# Patient Record
Sex: Female | Born: 1960 | Race: White | Hispanic: No | Marital: Married | Smoking: Never smoker
Health system: Southern US, Community
[De-identification: ages and names within clinical notes are randomized; demographics above are authoritative.]

## PROBLEM LIST (undated history)

## (undated) DIAGNOSIS — E785 Hyperlipidemia, unspecified: Secondary | ICD-10-CM

## (undated) DIAGNOSIS — M1990 Unspecified osteoarthritis, unspecified site: Secondary | ICD-10-CM

## (undated) DIAGNOSIS — G4700 Insomnia, unspecified: Secondary | ICD-10-CM

## (undated) DIAGNOSIS — E079 Disorder of thyroid, unspecified: Secondary | ICD-10-CM

## (undated) DIAGNOSIS — G709 Myoneural disorder, unspecified: Secondary | ICD-10-CM

## (undated) HISTORY — DX: Hyperlipidemia, unspecified: E78.5

## (undated) HISTORY — DX: Myoneural disorder, unspecified: G70.9

## (undated) HISTORY — DX: Disorder of thyroid, unspecified: E07.9

## (undated) HISTORY — DX: Insomnia, unspecified: G47.00

## (undated) HISTORY — DX: Unspecified osteoarthritis, unspecified site: M19.90

---

## 2011-12-25 DIAGNOSIS — K589 Irritable bowel syndrome without diarrhea: Secondary | ICD-10-CM | POA: Insufficient documentation

## 2011-12-25 DIAGNOSIS — F325 Major depressive disorder, single episode, in full remission: Secondary | ICD-10-CM | POA: Insufficient documentation

## 2012-05-13 DIAGNOSIS — R947 Abnormal results of other endocrine function studies: Secondary | ICD-10-CM | POA: Insufficient documentation

## 2013-06-17 DIAGNOSIS — E038 Other specified hypothyroidism: Secondary | ICD-10-CM | POA: Insufficient documentation

## 2013-06-17 DIAGNOSIS — E039 Hypothyroidism, unspecified: Secondary | ICD-10-CM | POA: Insufficient documentation

## 2013-06-17 DIAGNOSIS — F5101 Primary insomnia: Secondary | ICD-10-CM | POA: Insufficient documentation

## 2013-07-07 DIAGNOSIS — M797 Fibromyalgia: Secondary | ICD-10-CM | POA: Insufficient documentation

## 2013-10-04 DIAGNOSIS — M19049 Primary osteoarthritis, unspecified hand: Secondary | ICD-10-CM | POA: Insufficient documentation

## 2014-04-06 LAB — VITAMIN D 25 HYDROXY (VIT D DEFICIENCY, FRACTURES): VIT D 25 HYDROXY: 34.9

## 2014-04-06 LAB — LYME DISEASE AB, QUANT, IGM

## 2014-04-06 LAB — BASIC METABOLIC PANEL
BUN: 15 mg/dL (ref 4–21)
Creatinine: 0.7 mg/dL (ref <=1.1)
GLUCOSE: 124 mg/dL
POTASSIUM: 4.1 mmol/L (ref 3.4–5.3)
Sodium: 142 mmol/L (ref 137–147)

## 2014-04-06 LAB — CBC AND DIFFERENTIAL
HCT: 39 % (ref 36–46)
Hemoglobin: 13.2 g/dL (ref 12.0–16.0)
NEUTROS ABS: 3 /uL
PLATELETS: 297 10*3/uL (ref 150–399)
WBC: 5 10^3/mL

## 2014-04-06 LAB — TSH: TSH: 0.31 u[IU]/mL — AB (ref <=5.90)

## 2014-04-06 LAB — ROCKY MTN SPOTTED FVR AB, IGM-BLOOD
RMSF IGM: 0.29
RMSF IgG: NEGATIVE

## 2014-04-06 LAB — HEPATIC FUNCTION PANEL
ALT: 14 U/L (ref 7–35)
AST: 15 U/L (ref 13–35)
Alkaline Phosphatase: 90 U/L (ref 25–125)
BILIRUBIN, TOTAL: 0.3 mg/dL

## 2014-04-06 LAB — LYME IGG/IGM AB

## 2014-04-06 LAB — CALCIUM: Calcium: 9.8 mg/dL

## 2014-10-28 DIAGNOSIS — M8949 Other hypertrophic osteoarthropathy, multiple sites: Secondary | ICD-10-CM | POA: Insufficient documentation

## 2014-10-28 DIAGNOSIS — M159 Polyosteoarthritis, unspecified: Secondary | ICD-10-CM | POA: Insufficient documentation

## 2014-10-28 DIAGNOSIS — M150 Primary generalized (osteo)arthritis: Principal | ICD-10-CM

## 2014-11-15 DIAGNOSIS — N951 Menopausal and female climacteric states: Secondary | ICD-10-CM | POA: Insufficient documentation

## 2014-12-29 ENCOUNTER — Ambulatory Visit: Payer: Self-pay | Admitting: Internal Medicine

## 2016-05-08 ENCOUNTER — Encounter: Payer: Self-pay | Admitting: Family Medicine

## 2016-05-08 ENCOUNTER — Ambulatory Visit (INDEPENDENT_AMBULATORY_CARE_PROVIDER_SITE_OTHER): Payer: PRIVATE HEALTH INSURANCE | Admitting: Family Medicine

## 2016-05-08 VITALS — BP 110/74 | HR 73 | Ht 63.75 in | Wt 130.6 lb

## 2016-05-08 DIAGNOSIS — M150 Primary generalized (osteo)arthritis: Secondary | ICD-10-CM | POA: Diagnosis not present

## 2016-05-08 DIAGNOSIS — E038 Other specified hypothyroidism: Secondary | ICD-10-CM | POA: Diagnosis not present

## 2016-05-08 DIAGNOSIS — M8949 Other hypertrophic osteoarthropathy, multiple sites: Secondary | ICD-10-CM

## 2016-05-08 DIAGNOSIS — F325 Major depressive disorder, single episode, in full remission: Secondary | ICD-10-CM | POA: Diagnosis not present

## 2016-05-08 DIAGNOSIS — W57XXXA Bitten or stung by nonvenomous insect and other nonvenomous arthropods, initial encounter: Secondary | ICD-10-CM

## 2016-05-08 DIAGNOSIS — M797 Fibromyalgia: Secondary | ICD-10-CM | POA: Diagnosis not present

## 2016-05-08 DIAGNOSIS — M159 Polyosteoarthritis, unspecified: Secondary | ICD-10-CM

## 2016-05-08 DIAGNOSIS — K760 Fatty (change of) liver, not elsewhere classified: Secondary | ICD-10-CM | POA: Insufficient documentation

## 2016-05-08 DIAGNOSIS — F5101 Primary insomnia: Secondary | ICD-10-CM

## 2016-05-08 DIAGNOSIS — E039 Hypothyroidism, unspecified: Secondary | ICD-10-CM

## 2016-05-08 NOTE — Patient Instructions (Signed)
Try to exercise 30 minutes or more 5 days per week of moderate intensity exercise.     Osteoarthritis Osteoarthritis is a disease that causes soreness and inflammation of a joint. It occurs when the cartilage at the affected joint wears down. Cartilage acts as a cushion, covering the ends of bones where they meet to form a joint. Osteoarthritis is the most common form of arthritis. It often occurs in older people. The joints affected most often by this condition include those in the:  Ends of the fingers.  Thumbs.  Neck.  Lower back.  Knees.  Hips. CAUSES  Over time, the cartilage that covers the ends of bones begins to wear away. This causes bone to rub on bone, producing pain and stiffness in the affected joints.  RISK FACTORS Certain factors can increase your chances of having osteoarthritis, including:  Older age.  Excessive body weight.  Overuse of joints.  Previous joint injury. SIGNS AND SYMPTOMS   Pain, swelling, and stiffness in the joint.  Over time, the joint may lose its normal shape.  Small deposits of bone (osteophytes) may grow on the edges of the joint.  Bits of bone or cartilage can break off and float inside the joint space. This may cause more pain and damage. DIAGNOSIS  Your health care provider will do a physical exam and ask about your symptoms. Various tests may be ordered, such as:  X-rays of the affected joint.  Blood tests to rule out other types of arthritis. Additional tests may be used to diagnose your condition. TREATMENT  Goals of treatment are to control pain and improve joint function. Treatment plans may include:  A prescribed exercise program that allows for rest and joint relief.  A weight control plan.  Pain relief techniques, such as:  Properly applied heat and cold.  Electric pulses delivered to nerve endings under the skin (transcutaneous electrical nerve stimulation [TENS]).  Massage.  Certain nutritional  supplements.  Medicines to control pain, such as:  Acetaminophen.  Nonsteroidal anti-inflammatory drugs (NSAIDs), such as naproxen.  Narcotic or central-acting agents, such as tramadol.  Corticosteroids. These can be given orally or as an injection.  Surgery to reposition the bones and relieve pain (osteotomy) or to remove loose pieces of bone and cartilage. Joint replacement may be needed in advanced states of osteoarthritis. HOME CARE INSTRUCTIONS   Take medicines only as directed by your health care provider.  Maintain a healthy weight. Follow your health care provider's instructions for weight control. This may include dietary instructions.  Exercise as directed. Your health care provider can recommend specific types of exercise. These may include:  Strengthening exercises. These are done to strengthen the muscles that support joints affected by arthritis. They can be performed with weights or with exercise bands to add resistance.  Aerobic activities. These are exercises, such as brisk walking or low-impact aerobics, that get your heart pumping.  Range-of-motion activities. These keep your joints limber.  Balance and agility exercises. These help you maintain daily living skills.  Rest your affected joints as directed by your health care provider.  Keep all follow-up visits as directed by your health care provider. SEEK MEDICAL CARE IF:   Your skin turns red.  You develop a rash in addition to your joint pain.  You have worsening joint pain.  You have a fever along with joint or muscle aches. SEEK IMMEDIATE MEDICAL CARE IF:  You have a significant loss of weight or appetite.  You have night sweats.  FOR MORE INFORMATION   National Institute of Arthritis and Musculoskeletal and Skin Diseases: www.niams.http://www.myers.net/nih.gov  [employer] Aging: https://walker.com/www.nia.nih.gov  American College of Rheumatology: www.rheumatology.org   This information is not intended to replace  advice given to you by your health care provider. Make sure you discuss any questions you have with your health care provider.   Document Released: 09/09/2005 Document Revised: 09/30/2014 Document Reviewed: 05/17/2013 Elsevier Interactive Patient Education [employer]2016 Elsevier Inc.

## 2016-05-08 NOTE — Assessment & Plan Note (Addendum)
Lexapro for FM not for mood per pt.     Mood stable- no anxiety/ depressive sx currently.

## 2016-05-08 NOTE — Assessment & Plan Note (Signed)
Strong Fam Hx of hypothyr.   10 yrs ago- had bad sx of hypothyroidism.  Took meds 8 yrs--> but needed to go off due to tsh got too low- 1.5 yrs ago.

## 2016-05-08 NOTE — Progress Notes (Signed)
New patient office visit note:  Impression and Recommendations:    1. Primary osteoarthritis involving multiple joints   2. Fibromyalgia   3. Major depression, single episode, in complete remission (HCC)   4. Subclinical hypothyroidism   5. h/o Tick bites- never tested positive   6. Fatty liver disease, nonalcoholic   7. Primary insomnia     Fibromyalgia Been seeing  Dr Mardelle MatteAndy- PCP.    Tried sevella, lyrica, cymbalta- really never worked.  Rheum Doc- seen couple yrs ago.  [facility] in [place].  She confirmded OA, not RA.  Seeing PT- twice wkly 3 wks. [facility].   Major depression, single episode, in complete remission (HCC) Lexapro for FM not for mood per pt.     Mood stable- no anxiety/ depressive sx currently.   h/o Subclinical hypothyroidism- now N Strong Fam Hx of hypothyr.   10 yrs ago- had bad sx of hypothyroidism.  Took meds 8 yrs--> but needed to go off due to tsh got too low- 1.5 yrs ago.    h/o Fatty liver disease, nonalcoholic This was found incidentally by her gastroenterology team, [facility], in the remote past when they were doing imaging on CT scan and workup for chronic abdominal pain.  Primary insomnia Patient manage with Ambien for this condition. Understands risks associated with chronic use. She has failed trazodone for this condition.  Sleep hygiene and sleep meditation discussed. increase activity level during the day- will help you sleep  Primary osteoarthritis involving multiple joints Explained to patient that first line treatment options for primary osteoarthritis include dietary and lifestyle modifications to include at least 30 minutes of walking 5 or more days per week.   h/o Tick bites- never tested positive - >  Explained to patient that I'm not aware of the latest treatment guidelines and workup of chronic Lyme's condition. Explained it is highly unlikely any of her chronic arthralgias and myalgias are related to Lyme's since she  tested negative after her repeated bites in the past when she got lab work repeatedly at her PCPs office for this.   I have reviewed Dr. Mardelle MatteAndy and her colleagues notes on this.  Explained to patient I'm happy to refer her to another rheumatologist if she feels her condition warrants this.  Pt was in the office today for 40+ minutes, with over 50% time spent in face to face counseling of various medical concerns and in coordination of care  No orders of the defined types were placed in this encounter.   Patient's Medications  New Prescriptions   No medications on file  Previous Medications   CAYENNE 450 MG CAPS    Take 1 capsule by mouth daily.   CHOLECALCIFEROL (VITAMIN D3) 1000 UNITS CAPS    Take 1 tablet by mouth daily.   ESCITALOPRAM (LEXAPRO) 20 MG TABLET    Take 1 tablet by mouth daily.   HYDROCODONE-ACETAMINOPHEN (NORCO/VICODIN) 5-325 MG TABLET    Take 1 tablet by mouth every 6 (six) hours as needed.   MAGNESIUM PO    Take 1 capsule by mouth daily.   MULTIPLE VITAMIN (MULTIVITAMIN) TABLET    Take 1 tablet by mouth daily.   OMEGA-3 FATTY ACIDS (FISH OIL) 1000 MG CAPS    Take 1 capsule by mouth daily.   TURMERIC 450 MG CAPS    Take 1 tablet by mouth daily.   ZOLPIDEM (AMBIEN) 10 MG TABLET    Take 1 tablet by mouth at bedtime.  Modified Medications   No medications on file  Discontinued Medications   No medications on file    Return in about 5 weeks (around 06/12/2016) for Fasting blood work, then f/up OV with me.  The patient was counseled, risk factors were discussed, anticipatory guidance given.  Gross side effects, risk and benefits, and alternatives of medications discussed with patient.  Patient is aware that all medications have potential side effects and we are unable to predict every side effect or drug-drug interaction that may occur.  Expresses verbal understanding and consents to current therapy plan and treatment regimen.  Please see AVS handed out to patient at the end  of our visit for further patient instructions/ counseling done pertaining to today's office visit.    Note: This document was prepared using Dragon voice recognition software and may include unintentional dictation errors.  ----------------------------------------------------------------------------------------------------------------------    Subjective:    Chief Complaint  Patient presents with  . Establish Care    HPI: Alicia Harper is a pleasant 55 y.o. female who [facility] to review their medical history with me and establish care.   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     She has her masters degree.   She is married to Alicia Harper, whom she lives with as well as one of her daughters Alicia Harper age 413.  They have a total of 5 children together 27- 13yo.      Walks-  [id number] most days ; stretches some but no other exercise other than that   Fibromyalgia:    Been seeing  Dr Mardelle MatteAndy- PCP- last seen in 4/17.    Apparently she is on Lexapro for this condition.  Tried sevella, lyrica, cymbalta- really never worked.  So she sent patient to a Rheum Doc- seen couple yrs ago.  [facility] in [place].  She confirmded OA, not RA.   Pt has been doing PT- twice wkly 3 wks at [facility].   Depression:     Denies that she has a problem with anxiety depression.  Taking Lexapro for fibromyalgia not for mood per patient.  Insomnia:     Takes Ambien, failed trazodone.  She understands risks of long-term chronic use   Primary OA:   Affecting mostly her hands and neck at current. Confirmed via  x-rays.  Patient recently started [place] and tumor risk for this condition. Believes in more natural healing.    History of subclinical hypothyroidism:   Patient has a strong family history of hypothyroidism and approximate 10 years ago she states she had bad symptoms of the disease including fatigue, hair loss, feeling cold etc. Apparently she took meds for 8 years for this condition but needed to go off of them due to her TSH getting too low. She has not been on any medicines for at least 1.5 -2 years    History of multiple tick bites:      Patient seen by multiple doctors in the past for this. Apparently she was bitten by many ticks at once and was treated with doxycycline for what she claims was 30 days. Apparently all the tests came back negative and she was  followed up on a regular basis. Patient is convinced that she has chronic Lyme's myalgias and arthralgias and is interested in further testing for this.          Wt Readings from Last 3 Encounters:  05/08/16 130 lb 9.6 oz (59.2 kg)   BP Readings from Last 3 Encounters:  05/08/16 110/74   Pulse Readings from Last 3 Encounters:  05/08/16 73     Patient Active Problem List   Diagnosis Date Noted  . h/o Tick bites- never tested positive 05/08/2016  . h/o Fatty liver disease, nonalcoholic 05/08/2016  . Perimenopausal 11/15/2014  . Primary osteoarthritis involving multiple joints 10/28/2014  . Arthritis of carpometacarpal joint 10/04/2013  . Fibromyalgia 07/07/2013  . Primary insomnia 06/17/2013  . h/o Subclinical hypothyroidism- now N 06/17/2013  . Nonspecific abnormal results of other endocrine function study 05/13/2012  . IBS (irritable bowel syndrome) 12/25/2011  . Major depression, single episode, in complete remission (HCC) 12/25/2011     Past Medical History:  Diagnosis Date  . Arthritis   . Hyperlipidemia   . Insomnia   . Neuromuscular disorder (HCC)    fibromyalgia  . Thyroid disease      History  reviewed. No pertinent surgical history.   Family History  Problem Relation Age of Onset  . Cancer Father     kidney  . Hypertension Father   . Hypertension Maternal Grandmother   . Stroke Maternal Grandmother   . Stroke Maternal Grandfather   . Stroke Paternal Grandmother   . Heart attack Paternal Grandfather      History  Drug Use No    History  Alcohol Use No    History  Smoking Status  . Never Smoker  Smokeless Tobacco  . Never Used    Patient's Medications  New Prescriptions   No medications on file  Previous Medications   CAYENNE 450 MG CAPS    Take 1 capsule by mouth daily.   CHOLECALCIFEROL (VITAMIN D3) 1000 UNITS CAPS    Take 1 tablet by mouth daily.   ESCITALOPRAM (LEXAPRO) 20 MG TABLET    Take 1 tablet by mouth daily.   HYDROCODONE-ACETAMINOPHEN (NORCO/VICODIN) 5-325 MG TABLET    Take 1 tablet by mouth every 6 (six) hours as needed.   MAGNESIUM PO    Take 1 capsule by mouth daily.   MULTIPLE VITAMIN (MULTIVITAMIN) TABLET    Take 1 tablet by mouth daily.   OMEGA-3 FATTY ACIDS (FISH OIL) 1000 MG CAPS    Take 1 capsule by mouth daily.   TURMERIC 450 MG CAPS    Take 1 tablet by mouth daily.   ZOLPIDEM (AMBIEN) 10 MG TABLET    Take 1 tablet by mouth at bedtime.  Modified Medications   No medications on file  Discontinued Medications   No medications on file    Allergies: Tramadol and Savella [milnacipran]  Review of Systems:   ( Completed via Adult Medical History Intake form today) General:  Denies fever, chills, appetite changes, unexplained weight loss.  Optho/Auditory:   Denies visual changes, blurred vision/LOV, ringing in ears/ diff hearing Respiratory:   Denies SOB, DOE, cough, wheezing.  Cardiovascular:   Denies chest pain, palpitations, new onset peripheral edema  Gastrointestinal:   Denies nausea, vomiting, diarrhea.  Genitourinary:    Denies dysuria, increased frequency, flank pain.  Endocrine:     Denies hot or cold intolerance, polyuria,  polydipsia. Musculoskeletal:  Denies unexplained myalgias, joint swelling, arthralgias, gait problems.  Skin:  Denies rash,  suspicious lesions or new/ changes in moles Neurological:    Denies dizziness, syncope, unexplained weakness, lightheadedness, numbness  Psychiatric/Behavioral:   Denies mood changes, suicidal or homicidal ideations, hallucinations    Objective:   Blood pressure 110/74, pulse 73, height 5' 3.75" (1.619 m), weight 130 lb 9.6 oz (59.2 kg). Body mass index is 22.59 kg/m.   General: Well Developed, well nourished, and in no acute distress.  Neuro: Alert and oriented x3, extra-ocular muscles intact, sensation grossly intact.  HEENT: Normocephalic, atraumatic, pupils equal round reactive to light, neck supple, no gross masses, no carotid bruits, no JVD apprec Skin: no gross suspicious lesions or rashes  Cardiac: Regular rate and rhythm, no murmurs rubs or gallops.  Respiratory: Essentially clear to auscultation bilaterally. Not using accessory muscles, speaking in full sentences.  Abdominal: Soft, not grossly distended Musculoskeletal: Ambulates w/o diff, FROM * 4 ext.  Vasc: less 2 sec cap RF, warm and pink  Psych:  No HI/SI, judgement and insight good.

## 2016-05-08 NOTE — Assessment & Plan Note (Addendum)
Been seeing  Dr Mardelle MatteAndy- PCP.    Tried sevella, lyrica, cymbalta- really never worked.  Rheum Doc- seen couple yrs ago.  [facility] in [place].  She confirmded OA, not RA.  Seeing PT- twice wkly 3 wks. [facility].

## 2016-05-19 NOTE — Assessment & Plan Note (Addendum)
- >    Explained to patient that I'm not aware of the latest treatment guidelines and workup of chronic Lyme's condition. Explained it is highly unlikely any of her chronic arthralgias and myalgias are related to Lyme's since she tested negative after her repeated bites in the past when she got lab work repeatedly at her PCPs office for this.   I have reviewed Dr. Mardelle MatteAndy and her colleagues notes on this.  Explained to patient I'm happy to refer her to another rheumatologist if she feels her condition warrants this.

## 2016-05-19 NOTE — Assessment & Plan Note (Signed)
This was found incidentally by her gastroenterology team, [facility], in the remote past when they were doing imaging on CT scan and workup for chronic abdominal pain.

## 2016-05-19 NOTE — Assessment & Plan Note (Addendum)
Patient manage with Ambien for this condition. Understands risks associated with chronic use. She has failed trazodone for this condition.  Sleep hygiene and sleep meditation discussed. increase activity level during the day- will help you sleep

## 2016-05-19 NOTE — Assessment & Plan Note (Signed)
Explained to patient that first line treatment options for primary osteoarthritis include dietary and lifestyle modifications to include at least 30 minutes of walking 5 or more days per week.

## 2016-05-20 ENCOUNTER — Telehealth: Payer: Self-pay | Admitting: Family Medicine

## 2016-05-20 ENCOUNTER — Other Ambulatory Visit: Payer: Self-pay

## 2016-05-20 DIAGNOSIS — E039 Hypothyroidism, unspecified: Secondary | ICD-10-CM

## 2016-05-20 DIAGNOSIS — R947 Abnormal results of other endocrine function studies: Secondary | ICD-10-CM

## 2016-05-20 DIAGNOSIS — Z0000 Encounter for general adult medical examination without abnormal findings: Secondary | ICD-10-CM

## 2016-05-20 DIAGNOSIS — E038 Other specified hypothyroidism: Secondary | ICD-10-CM

## 2016-05-20 NOTE — Telephone Encounter (Signed)
Patient is coming in tomorrow for fasting labs (8/29). She had talked about doing a blood panel for tick transmitted diseases with Dr Val Eagle the initial visit and was wondering if that was still possible tomorrow. I didn't see any labs in at all so I told the patient I'd find out and get back with her to let her know today. If you call instead of me she can be reached all day on her mobile and leave a VM if she doesn't pick up.

## 2016-05-20 NOTE — Telephone Encounter (Signed)
Informed pt that per Dr. Synthia Innocentpalski's previous note, she would need to see rheumatologist for Lyme disease evaluation.  Offered pt referral to rheumatology, but pt states that she already has a rheumatologist that she sees.  Alicia Harper. Niklas Chretien, CMA

## 2016-05-21 ENCOUNTER — Other Ambulatory Visit (INDEPENDENT_AMBULATORY_CARE_PROVIDER_SITE_OTHER): Payer: PRIVATE HEALTH INSURANCE

## 2016-05-21 DIAGNOSIS — Z0000 Encounter for general adult medical examination without abnormal findings: Secondary | ICD-10-CM

## 2016-05-21 DIAGNOSIS — E038 Other specified hypothyroidism: Secondary | ICD-10-CM

## 2016-05-21 DIAGNOSIS — E039 Hypothyroidism, unspecified: Secondary | ICD-10-CM

## 2016-05-21 DIAGNOSIS — R947 Abnormal results of other endocrine function studies: Secondary | ICD-10-CM | POA: Diagnosis not present

## 2016-05-22 LAB — COMPREHENSIVE METABOLIC PANEL
ALBUMIN: 4.2 g/dL (ref 3.6–5.1)
ALK PHOS: 82 U/L (ref 33–130)
ALT: 16 U/L (ref 6–29)
AST: 20 U/L (ref 10–35)
BUN: 15 mg/dL (ref 7–25)
CHLORIDE: 101 mmol/L (ref 98–110)
CO2: 31 mmol/L (ref 20–31)
Calcium: 9.7 mg/dL (ref 8.6–10.4)
Creat: 0.81 mg/dL (ref 0.50–1.05)
GLUCOSE: 97 mg/dL (ref 65–99)
Potassium: 4.5 mmol/L (ref 3.5–5.3)
SODIUM: 141 mmol/L (ref 135–146)
TOTAL PROTEIN: 6.5 g/dL (ref 6.1–8.1)
Total Bilirubin: 0.4 mg/dL (ref 0.2–1.2)

## 2016-05-22 LAB — CBC WITH DIFFERENTIAL/PLATELET
BASOS ABS: 0 {cells}/uL (ref 0–200)
Basophils Relative: 0 %
EOS ABS: 86 {cells}/uL (ref 15–500)
Eosinophils Relative: 2 %
HEMATOCRIT: 37.5 % (ref 35.0–45.0)
HEMOGLOBIN: 12.5 g/dL (ref 11.7–15.5)
LYMPHS ABS: 1247 {cells}/uL (ref 850–3900)
Lymphocytes Relative: 29 %
MCH: 28.9 pg (ref 27.0–33.0)
MCHC: 33.3 g/dL (ref 32.0–36.0)
MCV: 86.6 fL (ref 80.0–100.0)
MONO ABS: 387 {cells}/uL (ref 200–950)
MPV: 9.5 fL (ref 7.5–12.5)
Monocytes Relative: 9 %
NEUTROS PCT: 60 %
Neutro Abs: 2580 cells/uL (ref 1500–7800)
Platelets: 287 10*3/uL (ref 140–400)
RBC: 4.33 MIL/uL (ref 3.80–5.10)
RDW: 13.8 % (ref 11.0–15.0)
WBC: 4.3 10*3/uL (ref 3.8–10.8)

## 2016-05-22 LAB — LIPID PANEL
CHOL/HDL RATIO: 3.3 ratio (ref <=5.0)
Cholesterol: 209 mg/dL — ABNORMAL HIGH (ref 125–200)
HDL: 63 mg/dL (ref >=46)
LDL CALC: 119 mg/dL (ref <130)
Triglycerides: 136 mg/dL (ref <150)
VLDL: 27 mg/dL (ref <30)

## 2016-05-22 LAB — VITAMIN D 25 HYDROXY (VIT D DEFICIENCY, FRACTURES): Vit D, 25-Hydroxy: 50 ng/mL (ref 30–100)

## 2016-05-22 LAB — HEMOGLOBIN A1C
HEMOGLOBIN A1C: 5.7 % — AB (ref <5.7)
MEAN PLASMA GLUCOSE: 117 mg/dL

## 2016-05-22 LAB — TSH: TSH: 3.79 mIU/L

## 2016-06-04 ENCOUNTER — Encounter: Payer: Self-pay | Admitting: Family Medicine

## 2016-06-04 ENCOUNTER — Ambulatory Visit (INDEPENDENT_AMBULATORY_CARE_PROVIDER_SITE_OTHER): Payer: PRIVATE HEALTH INSURANCE | Admitting: Family Medicine

## 2016-06-04 VITALS — BP 122/72 | HR 73 | Wt 134.0 lb

## 2016-06-04 DIAGNOSIS — E039 Hypothyroidism, unspecified: Secondary | ICD-10-CM

## 2016-06-04 DIAGNOSIS — L299 Pruritus, unspecified: Secondary | ICD-10-CM

## 2016-06-04 DIAGNOSIS — R7303 Prediabetes: Secondary | ICD-10-CM

## 2016-06-04 DIAGNOSIS — R21 Rash and other nonspecific skin eruption: Secondary | ICD-10-CM

## 2016-06-04 DIAGNOSIS — E038 Other specified hypothyroidism: Secondary | ICD-10-CM

## 2016-06-04 DIAGNOSIS — R6889 Other general symptoms and signs: Secondary | ICD-10-CM | POA: Insufficient documentation

## 2016-06-04 DIAGNOSIS — J3089 Other allergic rhinitis: Secondary | ICD-10-CM | POA: Diagnosis not present

## 2016-06-04 DIAGNOSIS — R5382 Chronic fatigue, unspecified: Secondary | ICD-10-CM

## 2016-06-04 DIAGNOSIS — R0982 Postnasal drip: Secondary | ICD-10-CM | POA: Diagnosis not present

## 2016-06-04 DIAGNOSIS — L659 Nonscarring hair loss, unspecified: Secondary | ICD-10-CM

## 2016-06-04 DIAGNOSIS — E559 Vitamin D deficiency, unspecified: Secondary | ICD-10-CM

## 2016-06-04 MED ORDER — TRIAMCINOLONE ACETONIDE 0.1 % EX OINT: 1.0000 "application " | TOPICAL_OINTMENT | Freq: Three times a day (TID) | CUTANEOUS | 1 refills | Status: DC

## 2016-06-04 NOTE — Patient Instructions (Addendum)
On for URI type symptoms-take Flonase over-the-counter daily. This should be 1 squirt in each nostril after you do your sinus rinses twice daily. Milta Deiters med sinus rinse, AYR or Nettie pot etc)   For the itch, take an H2 blocker- rantidine OTC daily as well as allegra -D- I would cont this thru the allergy season.  Also use the steroid cream I gave you 2-3 times daily just on the "bites".   Shoot for 13mn daily cardio- thru speed walking   Recheck TSH in 3 months, A1c.    Please see this website for more information about fibromyalgia:  https://[email or web address].[email or web address].it What Is Fibromyalgia?  Fibromyalgia is a common disorder that involves widespread pain, tenderness, fatigue, and other symptoms. It's not a form of arthritis, but like arthritis, it can interfere with a person's ability to perform everyday activities. An estimated 5 million American adults have fibromyalgia. Between 80 and 90 percent of people with fibromyalgia are women, but men and children can also have this condition.  More About Fibromyalgia  For more information about fibromyalgia, visit the [facility] Ar[facility] and Musculoskeletal and Skin Diseases Web site.   What's the B[employer]gy Corporationregarding alternative treatments for Fibromyalgia?  What do we know about the effectiveness of complementary health approaches for fibromyalgia?  Although some studies of tai chi, yoga, mindfulness meditation, and biofeedback for fibromyalgia have had promising results, the evidence is too limited to allow definite conclusions to be reached about whether these approaches are helpful.  It's uncertain whether acupuncture is helpful for fibromyalgia pain.  Vitamin D supplements may reduce pain in people with fibromyalgia who are deficient in this vitamin.  Some preliminary research on transcranial magnetic stimulation (TMS) for fibromyalgia symptoms has had promising results.  What do we know about the  safety of complementary health approaches for fibromyalgia?   The mind and body approaches discussed here generally have good safety records. However, some may need to be adapted to make them safe and comfortable for people with fibromyalgia.  Some of the natural products that have been studied for fibromyalgia may have side effects or interact with medications.  Headaches sometimes occur as a side effect of TMS for fibromyalgia. TMS and other procedures involving magnets may not be safe for people who have metal implants or medical devices such as pacemakers in their bodies.   What the SSonomafor Fibromyalgia: Mind and Body Practices:  -Acupuncture -Biofeedback -Guided Imagery -Massage Therapy -Meditative Movement Practices (Tai Chi, QMarcelino Duster and Yoga) -Mindfulness Meditation -Other Mind and Body Practices  Natural Products:  -It has been suggested that deficiencies in vitamin D might worsen fibromyalgia symptoms. In one study of women with fibromyalgia who had low vitamin D levels, 20 weeks of vitamin D supplementation led to a reduction in pain.  -Researchers are investigating whether low magnesium levels contribute to fibromyalgia and if magnesium supplements might help to reduce symptoms.  -Other natural products that have been studied for fibromyalgia include dietary supplements such as soy, S-adenosyl-L-methionine (SAMe), and creatine, and topical products containing capsaicin (the substance that gives chili peppers their heat). --> There's not enough evidence to determine whether these products are helpful.  - And remember, "natural" doesn't necessarily mean "safe." Natural products can have side effects, and some may interact with medications. Even vitamins and minerals can be harmful if taken in excessive amounts.  Make sure he work with your doctor and monitor for adequate levels.   Other Complementary  Approaches to  Explore: -Balneotherapy -Homeopathy -Magnetic Therapies -Reiki   NCCIH-Funded Research  Recent NCCIH-sponsored studies have been investigating various aspects of complementary and integrative interventions for fibromyalgia, including:  -The effectiveness of traditional [place] medicine for treating fibromyalgia -How tai chi compares to aerobic exercise as an adjunctive treatment for fibromyalgia symptoms -Whether brain responses to placebos differ between people with fibromyalgia and healthy people.  More to Consider: Be aware that some complementary health approaches-particularly dietary supplements-may interact with conventional medical treatments. To learn more about using dietary supplements, see the [facility] fact sheet Using Dietary Supplements Wisely and the [facility] interactive module Understanding Drug-Supplement Interactions.  If you're considering a practitioner-provided complementary health approach such as acupuncture, check with your insurer to see if the services will be covered, and ask a trusted source (like your fibromyalgia health care provider or a nearby hospital or medical school) to recommend a practitioner.  Tell all your health care providers about any complementary or integrative health approaches you use. Give them a full picture of what you do to manage your health. This will help ensure coordinated and safe care.   For More Information:  1) [place]: The [place] provides information on [facility] and complementary and integrative health approaches, including publications and searches of [employer] of scientific and medical literature. The Clearinghouse does not provide medical advice, treatment recommendations, or referrals to practitioners. Toll-free in the [place].:   3204949757 TTY (for deaf and hard-of-hearing callers):   [phone] Web site:   VoipGurus.fr E-mail:   [email or web address] .[email or web address].es (link sends e-mail)  2) [place] (NIAMS):  The mission of NIAMS is to support research into the causes, treatment, and prevention of arthritis and musculoskeletal and skin diseases; the training of basic and clinical scientists to carry out this research; and the dissemination of information on research progress in these diseases. Toll-free in the [place].:   1-877-22-NIAMS Web site:   Www.niams.SouthExposed.es  3) PubMed A service of the [employer] of Medicine, PubMed contains publication information and (in most cases) brief summaries of articles from scientific and medical journals. For guidance from [facility] on using PubMed, see How To Find Information About Complementary Health Approaches on PubMed. Web site:   https://www.davis.org/  4) MedlinePlus To provide resources that help answer health questions, MedlinePlus (a service of the [employer] of Medicine) brings together authoritative information from the [employer] as well as other Government agencies and [employer]. Web site:   Www.medlineplus.gov  ----------------------------------------------------------------------------------------------------------------------  Certified Practitioners in Acupuncture and [place] certified   Lenetta Quaker, [place] Gender - Female [address]  [place] [email or web address] [phone] Gender - Female  [place] [phone]   Isabella Stalling [phone] Gender - Female [place] [address]   [place], [place] [phone] Gender - Female [address] [place] [phone] [email or web address]    Scheryl Marten [phone] Gender - Female [place] [phone] [email or web address] [phone] Gender - Female [place] [email or web address] [phone] Gender - Female [place] [address]  [place], [place] [phone] Gender - Female [address] Ignacia Marvel  [place] [address]   Harlene Salts [phone] Gender - Female [place] [address]   [place], [place] Gender - Female [place] [phone]

## 2016-06-04 NOTE — Assessment & Plan Note (Signed)
Reck 75mo

## 2016-06-04 NOTE — Progress Notes (Signed)
Assessment and plan:  1. Itching   2. Rash and nonspecific skin eruption   3. PND (post-nasal drip)   4. Environmental and seasonal allergies   5. Vitamin D insufficiency   6. Prediabetes   7. h/o Subclinical hypothyroidism- now N   8. Chronic fatigue   9. Hair loss   10. Cold intolerance    For the itch, take an H2 blocker- rantidine OTC daily as well as allegra -D- I would cont this thru the allergy season.  Also use the steroid ointment I prescribed you 2-3 times daily just on the red bumps. ( d/c pt ?able flee bites but pt says no way.) Precautions d/c pt anyway.  On for URI / Hay fever type symptoms-take Flonase over-the-counter daily. This should be 1 squirt in each nostril after you do your sinus rinses twice daily. Milta Deiters med sinus rinse, AYR or Nettie pot etc)  I also told patient she can add a Allegra-D or Zyrtec-D etc. to her regimen if she would like.  - Vitamin D supplementation discussed with patient.  - Lab results and with the diagnosis of prediabetes means discussed in detail. All questions answered. Discussed dietary and lifestyle modifications with patient. Recheck 6 months  - We'll refer patient to endocrinology for her marriage of symptoms which have been chronic and ongoing for many years. Thyroid function looks within normal limits on recent labs- patient would like to see the specialist about her sx  Shoot for 46mn daily cardio- thru speed walking; adequate water intake stressed to patient   Recheck TSH in 3 months, A1c.  Pt was in the office today for 40+ minutes, with over 50% time spent in face to face counseling of various medical concerns and in coordination of care   Patient's Medications  New Prescriptions   TRIAMCINOLONE OINTMENT (KENALOG) 0.1 %    Apply 1 application topically 3 (three) times daily. To affected areas  Previous Medications   CHOLECALCIFEROL (VITAMIN D3) 1000 UNITS  CAPS    Take 1 tablet by mouth daily.   ESCITALOPRAM (LEXAPRO) 20 MG TABLET    Take 1 tablet by mouth daily.   FLAXSEED, LINSEED, (FLAX SEEDS PO)    Take by mouth.   HYDROCODONE-ACETAMINOPHEN (NORCO/VICODIN) 5-325 MG TABLET    Take 1 tablet by mouth every 6 (six) hours as needed.   MAGNESIUM PO    Take 1 capsule by mouth daily.   MULTIPLE VITAMIN (MULTIVITAMIN) TABLET    Take 1 tablet by mouth daily.   TURMERIC 450 MG CAPS    Take 1 tablet by mouth daily.   ZOLPIDEM (AMBIEN) 10 MG TABLET    Take 1 tablet by mouth at bedtime.  Modified Medications   No medications on file  Discontinued Medications   CAYENNE 450 MG CAPS    Take 1 capsule by mouth daily.   OMEGA-3 FATTY ACIDS (FISH OIL) 1000 MG CAPS    Take 1 capsule by mouth daily.    Return in about 3 months (around 09/03/2016) for f/up chronic issues.  Anticipatory guidance and routine counseling done re: condition, txmnt options and need for follow up. All questions of patient's were answered.   Gross side effects, risk and benefits, and alternatives of medications discussed with patient.  Patient is aware that all medications have potential side effects and we are unable to predict every sideeffect or drug-drug interaction that may occur.  Expresses verbal understanding and consents to current therapy plan  and treatment regiment.  Please see AVS handed out to patient at the end of our visit for additional patient instructions/ counseling done pertaining to today's office visit.  Note: This document was prepared using Dragon voice recognition software and may include unintentional dictation errors.   ----------------------------------------------------------------------------------------------------------------------  Subjective:   CC: Alicia Harper is a 55 y.o. female who presents to [place] [facility] today to  1) review her recent labs and discuss dietary and lifestyle counseling to help prevent disease.   However, she has several additional concerns   2) Itching all over- generalized for months now- all summer. No rash at all. Just felt itchy on hair, body, face, ears etc.  Than 2 wks ago--> started with red itchy bumps started on neck first then went to legs-> distal on ankles to proximal.  Itch NOT worse at night.  Waves of intensity throughout the day--> sometimes not itch at all and other times very bad.  Tried benadryl spray, campo-phen, HC 1% cream, benadryl.  No one else in the family has rash    3) She also wants to know why she has extreme fatigue, hair loss and cold intolerance for over 2 months.  She is "certain it is b/c of her thyroid."      4) also complains of lymph nodes are swollen, sore throat and pressure in her ears 1 month.    5) also complains of generalized joint and muscle aches as well as a.m. stiffness. She states she gets this way because of her fibromyalgia and generalized osteoarthritis. Pt wants to know why she gets these flairs.    Not exercising at all. Not hitting her goal water intake daily.      She denies feeling depressed, anxious, or sad. She says these are just symptoms of feeling tired and having little energy. She is convinced that she has an abnormality of her thyroid and saw a endocrinologist for her sx in past.   Lost to f/up as they told her she did not need meds any longer   Wt Readings from Last 3 Encounters:  06/04/16 134 lb (60.8 kg)  05/08/16 130 lb 9.6 oz (59.2 kg)   BP Readings from Last 3 Encounters:  06/04/16 122/72  05/08/16 110/74   Pulse Readings from Last 3 Encounters:  06/04/16 73  05/08/16 73      Full medical history updated and reviewed in the office today  Patient Active Problem List   Diagnosis Date Noted  . Itching 06/04/2016  . Rash and nonspecific skin eruption 06/04/2016  . PND (post-nasal drip) 06/04/2016  . Environmental and seasonal allergies 06/04/2016  . Vitamin D insufficiency 06/04/2016  .  Prediabetes 06/04/2016  . Chronic fatigue 06/04/2016  . Hair loss 06/04/2016  . Cold intolerance 06/04/2016  . h/o Tick bites- never tested positive 05/08/2016  . h/o Fatty liver disease, nonalcoholic 83/41/9622  . Perimenopausal 11/15/2014  . Primary osteoarthritis involving multiple joints 10/28/2014  . Arthritis of carpometacarpal joint 10/04/2013  . Fibromyalgia 07/07/2013  . Primary insomnia 06/17/2013  . h/o Subclinical hypothyroidism- now N 06/17/2013  . Nonspecific abnormal results of other endocrine function study 05/13/2012  . IBS (irritable bowel syndrome) 12/25/2011  . Major depression, single episode, in complete remission ([place]) 12/25/2011    Past Medical History:  Diagnosis Date  . Arthritis   . Hyperlipidemia   . Insomnia   . Neuromuscular disorder (HCC)    fibromyalgia  . Thyroid disease  No past surgical history on file.  Social History  Substance Use Topics  . Smoking status: Never Smoker  . Smokeless tobacco: Never Used  . Alcohol use No    family history includes Cancer in her father; Heart attack in her paternal grandfather; Hypertension in her father and maternal grandmother; Stroke in her maternal grandfather, maternal grandmother, and paternal grandmother.   Medications: Current Outpatient Prescriptions  Medication Sig Dispense Refill  . Cholecalciferol (VITAMIN D3) 1000 units CAPS Take 1 tablet by mouth daily.    Marland Kitchen escitalopram (LEXAPRO) 20 MG tablet Take 1 tablet by mouth daily.    . Flaxseed, Linseed, (FLAX SEEDS PO) Take by mouth.    Marland Kitchen HYDROcodone-acetaminophen (NORCO/VICODIN) 5-325 MG tablet Take 1 tablet by mouth every 6 (six) hours as needed.    Marland Kitchen MAGNESIUM PO Take 1 capsule by mouth daily.    . Multiple Vitamin (MULTIVITAMIN) tablet Take 1 tablet by mouth daily.    . Turmeric 450 MG CAPS Take 1 tablet by mouth daily.    Marland Kitchen zolpidem (AMBIEN) 10 MG tablet Take 1 tablet by mouth at bedtime.    . triamcinolone ointment (KENALOG) 0.1 %  Apply 1 application topically 3 (three) times daily. To affected areas 60 g 1   No current facility-administered medications for this visit.     Allergies:  Allergies  Allergen Reactions  . Tramadol Itching  . Savella [Milnacipran] Palpitations    Chest pain     ROS:  Const:    no fevers, chills- no just cold intol Eyes:    See above HPI ENT:  no hearing difficulties, no dysphagia, no dysphonia, no nose bleeds CV:   no chest pain, arrhythmias, no orthopnea, no PND Pulm:   no SOB at rest or exertion, no Wheeze, no DIB, no hemoptysis GI:    no N/V/D/C, no abd pain GU:   no blood in urine or inc freq or urgency Heme/Onc:    no unexplained bleeding, no night sweats, no more fatigue than usual- just constant for months now Neuro:   No dizziness, no LOC, No unexplained weakness or numbness- chronic due to OA and FM Endo:   no unexplained wt loss or gain M-Sk:   No NEW localized myalgias or arthralgias Psych:    No SI/HI, no memory prob or unexplained confusion    Objective:  Blood pressure 122/72, pulse 73, weight 134 lb (60.8 kg), SpO2 97 %. Body mass index is 23.18 kg/m.  Gen:   Well NAD, A and O *3 HEENT:    [place]/AT, EOMI,  MMM, OP- clr Lungs:   Normal work of breathing. CTA B/L, no Wh, rhonchi Heart:   RRR, S1, S2 WNL's, no MRG Abd:   Soft. No gross distention Exts:    warm, pink,  Brisk capillary refill, warm and well perfused.  Psych:    No HI/SI, judgement and insight good, Euthymic mood. Full Affect.   Recent Results (from the past 2160 hour(s))  Comp Met (CMET)     Status: None   Collection Time: 05/21/16  8:40 AM  Result Value Ref Range   Sodium 141 135 - 146 mmol/L   Potassium 4.5 3.5 - 5.3 mmol/L   Chloride 101 98 - 110 mmol/L   CO2 31 20 - 31 mmol/L   Glucose, Bld 97 65 - 99 mg/dL   BUN 15 7 - 25 mg/dL   Creat 0.81 0.50 - 1.05 mg/dL    Comment:   For patients > or =  53 years of age: The upper reference limit for Creatinine is approximately 13% higher for  people identified as African-American.      Total Bilirubin 0.4 0.2 - 1.2 mg/dL   Alkaline Phosphatase 82 33 - 130 U/L   AST 20 10 - 35 U/L   ALT 16 6 - 29 U/L   Total Protein 6.5 6.1 - 8.1 g/dL   Albumin 4.2 3.6 - 5.1 g/dL   Calcium 9.7 8.6 - 10.4 mg/dL  CBC w/Diff     Status: None   Collection Time: 05/21/16  8:40 AM  Result Value Ref Range   WBC 4.3 3.8 - 10.8 K/uL   RBC 4.33 3.80 - 5.10 MIL/uL   Hemoglobin 12.5 11.7 - 15.5 g/dL   HCT 37.5 35.0 - 45.0 %   MCV 86.6 80.0 - 100.0 fL   MCH 28.9 27.0 - 33.0 pg   MCHC 33.3 32.0 - 36.0 g/dL   RDW 13.8 11.0 - 15.0 %   Platelets 287 140 - 400 K/uL   MPV 9.5 7.5 - 12.5 fL   Neutro Abs 2,580 1,500 - 7,800 cells/uL   Lymphs Abs 1,247 850 - 3,900 cells/uL   Monocytes Absolute 387 200 - 950 cells/uL   Eosinophils Absolute 86 15 - 500 cells/uL   Basophils Absolute 0 0 - 200 cells/uL   Neutrophils Relative % 60 %   Lymphocytes Relative 29 %   Monocytes Relative 9 %   Eosinophils Relative 2 %   Basophils Relative 0 %   Smear Review Criteria for review not met   Lipid panel     Status: Abnormal   Collection Time: 05/21/16  8:40 AM  Result Value Ref Range   Cholesterol 209 (H) 125 - 200 mg/dL   Triglycerides 136 <150 mg/dL   HDL 63 >=46 mg/dL   Total CHOL/HDL Ratio 3.3 <=5.0 Ratio   VLDL 27 <30 mg/dL   LDL Cholesterol 119 <130 mg/dL    Comment:   Total Cholesterol/HDL Ratio:CHD Risk                        Coronary Heart Disease Risk Table                                        Men       Women          1/2 Average Risk              3.4        3.3              Average Risk              5.0        4.4           2X Average Risk              9.6        7.1           3X Average Risk             23.4       11.0 Use the calculated Patient Ratio above and the CHD Risk table  to determine the patient's CHD Risk.   TSH     Status: None   Collection Time: 05/21/16  8:40 AM  Result Value Ref Range  TSH 3.79 mIU/L    Comment:     Reference Range   > or = 20 Years  0.40-4.50   Pregnancy Range First trimester  0.26-2.66 Second trimester 0.55-2.73 Third trimester  0.43-2.91     Hemoglobin A1c     Status: Abnormal   Collection Time: 05/21/16  8:40 AM  Result Value Ref Range   Hgb A1c MFr Bld 5.7 (H) <5.7 %    Comment:   For someone without known diabetes, a hemoglobin A1c value between 5.7% and 6.4% is consistent with prediabetes and should be confirmed with a follow-up test.   For someone with known diabetes, a value <7% indicates that their diabetes is well controlled. A1c targets should be individualized based on duration of diabetes, age, co-morbid conditions and other considerations.   This assay result is consistent with an increased risk of diabetes.   Currently, no consensus exists regarding use of hemoglobin A1c for diagnosis of diabetes in children.      Mean Plasma Glucose 117 mg/dL  VITAMIN D 25 Hydroxy (Vit-D Deficiency, Fractures)     Status: None   Collection Time: 05/21/16  8:40 AM  Result Value Ref Range   Vit D, 25-Hydroxy 50 30 - 100 ng/mL    Comment: Vitamin D Status           25-OH Vitamin D        Deficiency                <20 ng/mL        Insufficiency         20 - 29 ng/mL        Optimal             > or = 30 ng/mL   For 25-OH Vitamin D testing on patients on D2-supplementation and patients for whom quantitation of D2 and D3 fractions is required, the QuestAssureD 25-OH VIT D, (D2,D3), LC/MS/MS is recommended: order code [phone] (patients > 2 yrs).

## 2016-07-22 ENCOUNTER — Ambulatory Visit: Payer: PRIVATE HEALTH INSURANCE | Admitting: Endocrinology

## 2016-07-25 ENCOUNTER — Ambulatory Visit (INDEPENDENT_AMBULATORY_CARE_PROVIDER_SITE_OTHER): Payer: PRIVATE HEALTH INSURANCE | Admitting: Endocrinology

## 2016-07-25 ENCOUNTER — Encounter: Payer: Self-pay | Admitting: Endocrinology

## 2016-07-25 VITALS — BP 126/84 | HR 81 | Ht 63.75 in | Wt 131.0 lb

## 2016-07-25 DIAGNOSIS — R5382 Chronic fatigue, unspecified: Secondary | ICD-10-CM | POA: Diagnosis not present

## 2016-07-25 DIAGNOSIS — R200 Anesthesia of skin: Secondary | ICD-10-CM | POA: Diagnosis not present

## 2016-07-25 LAB — VITAMIN B12: VITAMIN B 12: 668 pg/mL (ref 211–911)

## 2016-07-25 LAB — CORTISOL
CORTISOL PLASMA: 22.1 ug/dL
Cortisol, Plasma: 5.3 ug/dL

## 2016-07-25 NOTE — Patient Instructions (Addendum)
You have an approximately 10% chance of getting diabetes per year.  You can reduce that by half, by taking once of several diabetes medications, or thru aggressive diet and exercise therapy.   blood tests are requested for you today.  We'll let you know about the results.

## 2016-07-25 NOTE — Progress Notes (Signed)
Subjective:    Patient ID: Alicia Harper, female    DOB: [birth year], 55 y.o.   MRN: [id number]  HPI Pt reports 20 years of hair loss throughout the head, and assoc fatigue.  she says in 2006, despite a normal TFT, she was rx'ed armour thyroid.  She fels only slightly better on it.  On the advice of her PCP, she stopped this x 6 months, then stopped.  She then went on synthroid again despite normal TFT.  She took this x 6 years.  She stopped in 2015, due to palpitations and suppressed TSH.  She has never had XRT to the anterior neck, or thyroid surgery.  she has never had thyroid imaging.  she does not consume kelp or any other prescribed or non-prescribed thyroid medication.  she has never been on amiodarone.  She says the sxs resumed earlier this year.   Past Medical History:  Diagnosis Date  . Arthritis   . Hyperlipidemia   . Insomnia   . Neuromuscular disorder (HCC)    fibromyalgia  . Thyroid disease     No past surgical history on file.  Social History   Social History  . Marital status: Married    Spouse name: N/A  . Number of children: N/A  . Years of education: N/A   Occupational History  . Not on file.   Social History Main Topics  . Smoking status: Never Smoker  . Smokeless tobacco: Never Used  . Alcohol use No  . Drug use: No  . Sexual activity: Yes    Birth control/ protection: None   Other Topics Concern  . Not on file   Social History Narrative  . No narrative on file    Current Outpatient Prescriptions on File Prior to Visit  Medication Sig Dispense Refill  . Cholecalciferol (VITAMIN D3) 1000 units CAPS Take 1 tablet by mouth daily.    Marland Kitchen. escitalopram (LEXAPRO) 20 MG tablet Take by mouth daily. 1/2    . Flaxseed, Linseed, (FLAX SEEDS PO) Take by mouth.    Marland Kitchen. HYDROcodone-acetaminophen (NORCO/VICODIN) 5-325 MG tablet Take 1 tablet by mouth every 6 (six) hours as needed.    Marland Kitchen. MAGNESIUM PO Take 1 capsule by mouth daily.    . Multiple Vitamin (MULTIVITAMIN)  tablet Take 1 tablet by mouth daily.    . Turmeric 450 MG CAPS Take 1 tablet by mouth daily.    Marland Kitchen. zolpidem (AMBIEN) 10 MG tablet Take 1 tablet by mouth at bedtime.     No current facility-administered medications on file prior to visit.     Allergies  Allergen Reactions  . Tramadol Itching  . Savella [Milnacipran] Palpitations    Chest pain    Family History  Problem Relation Age of Onset  . Cancer Father     kidney  . Hypertension Father   . Hypothyroidism Father   . Hypertension Maternal Grandmother   . Stroke Maternal Grandmother   . Stroke Maternal Grandfather   . Stroke Paternal Grandmother   . Heart attack Paternal Grandfather   . Hypothyroidism Mother   . Hypothyroidism Sister     BP 126/84   Pulse 81   Ht 5' 3.75" (1.619 m)   Wt 131 lb (59.4 kg)   SpO2 97%   BMI 22.66 kg/m    Review of Systems denies sob, weight gain, constipation, numbness, blurry vision, dry skin, and syncope.  She had difficulty with concentration, anxiety, insomnia, leg cramps, rhinorrhea, easy bruising, numbness of the  feet, arthralgias, and cold intolerance.      Objective:   Physical Exam VS: see vs page GEN: no distress HEAD: head: no deformity eyes: no periorbital swelling, no proptosis external nose and ears are normal mouth: no lesion seen NECK: supple, thyroid is not enlarged CHEST WALL: no deformity LUNGS: clear to auscultation CV: reg rate and rhythm, no murmur ABD: abdomen is soft, nontender.  no hepatosplenomegaly.  not distended.  no hernia MUSCULOSKELETAL: muscle bulk and strength are grossly normal.  no obvious joint swelling.  gait is normal and steady.  EXTEMITIES: no deformity.  no ulcer on the feet.  feet are of normal color and temp.  no edema PULSES: dorsalis pedis intact bilat.  no carotid bruit NEURO:  cn 2-12 grossly intact.   readily moves all 4's.  sensation is intact to touch on the feet.  No tremor. SKIN:  Normal texture and temperature.  No rash or  suspicious lesion is visible.   NODES:  None palpable at the neck.   PSYCH: alert, well-oriented.  Does not appear anxious nor depressed.    I have reviewed outside records, and summarized: Pt was seen by PCP, and pt reported multiple symptoms.  Each was addressed.  She reported intermittent hypothyroidism.  Lab Results  Component Value Date   TSH 3.79 05/21/2016    Lab Results  Component Value Date   CREATININE 0.81 05/21/2016   BUN 15 05/21/2016   NA 141 05/21/2016   K 4.5 05/21/2016   CL 101 05/21/2016   CO2 31 05/21/2016   Lab Results  Component Value Date   HGBA1C 5.7 (H) 05/21/2016   ACTH stimulation test is done: baseline cortisol level=5 then Cosyntropin 250 mcg is given im 45 minutes later, cortisol level=22 (normal response)     Assessment & Plan:  Hyperglycemia, new to me Fatigue: no endocrine cause is found.   Numbness: check B-12.  Patient is advised the following: Patient Instructions  You have an approximately 10% chance of getting diabetes per year.  You can reduce that by half, by taking once of several diabetes medications, or thru aggressive diet and exercise therapy.   blood tests are requested for you today.  We'll let you know about the results.

## 2016-07-26 ENCOUNTER — Encounter: Payer: Self-pay | Admitting: Endocrinology

## 2016-07-26 MED ORDER — COSYNTROPIN NICU IV SYRINGE 0.25 MG/ML (STANDARD DOSE)
0.2500 mg | Freq: Once | INTRAVENOUS | Status: AC
  Administered 2016-07-25: 0.25 mg via INTRAMUSCULAR

## 2016-08-01 ENCOUNTER — Encounter: Payer: Self-pay | Admitting: Family Medicine

## 2016-08-01 ENCOUNTER — Ambulatory Visit (INDEPENDENT_AMBULATORY_CARE_PROVIDER_SITE_OTHER): Payer: PRIVATE HEALTH INSURANCE | Admitting: Family Medicine

## 2016-08-01 VITALS — BP 109/73 | HR 94 | Ht 63.75 in | Wt 131.5 lb

## 2016-08-01 DIAGNOSIS — H6122 Impacted cerumen, left ear: Secondary | ICD-10-CM

## 2016-08-01 DIAGNOSIS — H6981 Other specified disorders of Eustachian tube, right ear: Secondary | ICD-10-CM | POA: Diagnosis not present

## 2016-08-01 DIAGNOSIS — H8110 Benign paroxysmal vertigo, unspecified ear: Secondary | ICD-10-CM | POA: Insufficient documentation

## 2016-08-01 DIAGNOSIS — H6991 Unspecified Eustachian tube disorder, right ear: Secondary | ICD-10-CM | POA: Insufficient documentation

## 2016-08-01 MED ORDER — PREDNISONE 50 MG PO TABS: 50.0000 mg | ORAL_TABLET | Freq: Every day | ORAL | 0 refills | Status: DC

## 2016-08-01 MED ORDER — METHYLPREDNISOLONE ACETATE 40 MG/ML IJ SUSP
40.0000 mg | Freq: Once | INTRAMUSCULAR | Status: AC
  Administered 2016-08-01: 40 mg via INTRAMUSCULAR

## 2016-08-01 NOTE — Patient Instructions (Signed)
How to Treat Vertigo at Home with Exercises ? ?What is Vertigo? ? ?Vertigo is a relatively common symptom most often associated with conditions such as sinusitis (inflammation of your sinuses due to viruses, allergies, or bacterial infections), or an inner ear infection or ear trauma.   It can be brought on by trauma (e.g. a blow to the head or whiplash) or more serious things like minor strokes.   Symptoms can also be brought on by normal degenerative changes to your inner ear that occur with aging.  The condition tends to be more commonly seen in the elderly but it can occur in all ages.   ? ?Patients most often complain of dizziness, as if the room is spinning around them.   Symptoms are provoked by quick head movements or changes in position like going from standing to lying in bed, or even turning over in bed.   It may present with nausea and/or vomiting, and can be very debilitating to some folks.   ? ?By far the most common cause, known as Benign Paroxysmal Positional Vertigo (BPPV), is categorized by a sudden onset of symptoms, that are intense but short-lived (60 seconds or less), which is triggered by a change in head position.   Symptoms usually dissipate if you stay in one position and do not move your head.   Within the inner ear are collections of calcium carbonate crystals referred to as ?otoliths? which may become dislodged from their normal position and migrate into the semicircular canals of the inner ear, throwing off your body's ability to sense where you are in space.   ? ? ?Fig. 921 Anatomy of the Right Osseous Labyrinth. Henry Gray. Anatomy of the Human Body. 1918. ? ?  ? ?  ? ?  ? ? ?What Else Could Be Behind My Vertigo? ? ?Some other causes of vertigo include: ? ?Meniere?s disease (disorder of inner ear with ringing in ears, feeling of fullness/pressure within ear, and fluctuating hearing loss) ?Tumours ?Neurological disorders e.g. Multiple Sclerosis ?Motion Sickness (lack of coordination  between visual stimuli, inner ear balance and positional sense) ?Migraine ?Labyrinthitis (inflammation of the fluid-filled tubes and sacs within the inner ear; may also be associated with changes in hearing) ?Vestibular neuritis (inflammation of the nerves associated with transmission of sensory info from the inner ear; usually of viral origins) ? ?How it can be treated/cured? ?While certain medications have been prescribed for vertigo including Lorazepam your doing well 7 house the house going organizing and getting things ready for sale with the and Meclizine (for motion sickness), there exists no evidence to support a recommendation of any medication in the routine treatment of BPPV.  Clinical trials have demonstrated that repositioning techniques (listed below) are a superior option for management (Fife et al., 2008). ? ? ? ?Figure above:  (A) Instructions for the modified Epley procedure (MEP) for left ear posterior canal benign paroxysmal positional vertigo (PC-BPPV). For right ear BPPV, the procedure has to be performed in the opposite direction, starting with the head turned to the right side.  ?1. Start by sitting on a bed with your head turned 45? to the left. Place a pillow behind you so that on lying back it will be under your shoulders.  ?2. Lie back quickly with shoulders on the pillow, neck extended, and head resting on the bed. In this position, the affected (left) ear is underneath. Wait for 30 secondS.  ?3. Turn your head 90? to the right (without raising it), and   wait again for 30 seconds.  ?4. Turn your body and head another 90? to the right, and wait for another 30 seconds.  ?5. Sit up on the right side. This maneuver should be performed three times a day. Repeat this daily until you are free from positional vertigo for 24 hours. ? ? (B) Instructions for the modified Semont maneuver (MSM) for left ear PC-BPPV. For right ear BPPV, the maneuver has to be performed in the opposite direction,  starting with the head turned toward the left ear.  ?1. Sit upright on a bed with your head turned 45? toward the right ear.  ?2. Drop quickly to the left side, so that your head touches the bed behind your left ear. Wait 30 seconds.  ?3. Move head and trunk in a swift movement toward the other side without stopping in the upright position, so that your head comes to rest on the right side of your forehead. Wait again for 30 seconds.  ?4. Sit up again.  ?This maneuver should be performed three times a day. Repeat this daily until you are free from positional vertigo symptoms for 24 hours.  ? ?(   See the video in the supplementary material on the NeurologyWeb site; go to http://www.neurology.org/content/63/1/150/F1.expansion.html   ) ? ? ? ? ?You can also try this motion at home as well- ?Self-Treatment of Benign Paroxysmal Positional Vertigo ?Benign Paroxysmal Positioning Vertigo is caused by loose inner ear crystals in the inner ear that migrate while sleeping to the back-bottom inner ear balance canal, the so-called ?posterior semi-circular canal.? The maneuver demonstrated below is the way to reposition the loose crystals so that the symptoms caused by the loose crystals go away. You may have a floating, swaying sense while walking or sitting for a few days after this procedure.  ? ? ? ? ? ? ? ? ? ? ?

## 2016-08-01 NOTE — Progress Notes (Signed)
Impression and Recommendations:    1. BPPV (benign paroxysmal positional vertigo), unspecified laterality   2. ETD (Eustachian tube dysfunction), right   3. Left ear impacted cerumen     BPPV (benign paroxysmal positional vertigo), unspecified laterality - Counseled patient on pathophysiology of disease and discussed various treatment options, which often includes dietary and lifestyle modifications as first line, in addition to discussing the risks and benefits of various medications.     R/B meds d/c pt- consented to meds.  - Anticipatory guidance given. Handouts provided and told pt to do home exercises at least TID  - Encouraged to return to clinic or call the office with any further questions or concerns.   New Prescriptions   PREDNISONE (DELTASONE) 50 MG TABLET    Take 1 tablet (50 mg total) by mouth daily.    Modified Medications   No medications on file    Discontinued Medications   No medications on file    The patient was counseled, risk factors were discussed, anticipatory guidance given.  Gross side effects, risk and benefits, and alternatives of medications and treatment plan in general discussed with patient.  Patient is aware that all medications have potential side effects and we are unable to predict every side effect or drug-drug interaction that may occur.   Patient will call with any questions prior to using medication if they have concerns.  Expresses verbal understanding and consents to current therapy and treatment regimen.  No barriers to understanding were identified.  Red flag symptoms and signs discussed in detail.  Patient expressed understanding regarding what to do in case of emergency\urgent symptoms  Return if symptoms worsen or fail to improve.  Please see AVS handed out to patient at the end of our visit for further patient instructions/ counseling done pertaining to today's office visit.    Note: This document was prepared using  Dragon voice recognition software and may include unintentional dictation errors.   --------------------------------------------------------------------------------------------------------------------------------------------------------------------------------------------------------------------------------------------    Subjective:    CC:  Chief Complaint  Patient presents with  . Dizziness    HPI: Alicia Harper is a 55 y.o. female [facility] issues as discussed below.   About 10 days had a couple episodes of feeling "dizzy" / "swishing motion". progressly wrose past couple days.   Episodes lasts a couple seconds- no pattern to them.   Come on randlomly.   Not related to activity.   Had them in past several times- same thing for 7-8 yrs.   Pt had seen PCP about it in past.   Never been txed for it.  Told she had inner ear issues in past.   Tried bonine in past.  No head congestion, + ear fullness and soreness R >er L but in both.   No vis changes, no CV Sx/ heart palpitations/ SOB etc.    Tried- nothing     Wt Readings from Last 3 Encounters:  08/01/16 131 lb 8 oz (59.6 kg)  07/25/16 131 lb (59.4 kg)  06/04/16 134 lb (60.8 kg)   BP Readings from Last 3 Encounters:  08/01/16 109/73  07/25/16 126/84  06/04/16 122/72   Pulse Readings from Last 3 Encounters:  08/01/16 94  07/25/16 81  06/04/16 73   BMI Readings from Last 3 Encounters:  08/01/16 22.75 kg/m  07/25/16 22.66 kg/m  06/04/16 23.18 kg/m     Patient Care Team    Relationship  Specialty Notifications Start End  Thomasene Loteborah Lillyona Polasek, DO PCP - General Family Medicine  05/07/16   Andria RheinEthan Wiesler, MD Orthopedic Technician Orthopedic Surgery  05/21/16   Rossie Muskratauseef G Syed, MD Consulting Physician Rheumatology  05/21/16   Fran LowesLandon E Weeks Consulting Physician Gastroenterology  05/21/16   Richrd SoxPeter McHugh, MD Referring Physician Endocrinology  05/21/16   Pamelia HoitJames Mothershed, DPM  Consulting Physician Podiatry  05/21/16   Ardell Isaacsavid Spivey, MD Consulting Physician Pain Medicine  05/21/16   Romelle StarcherWilliam B Turner, DDS Consulting Physician Dentistry  05/21/16     Patient Active Problem List   Diagnosis Date Noted  . Prediabetes 06/04/2016    Priority: High  . Chronic fatigue 06/04/2016    Priority: High  . Fibromyalgia 07/07/2013    Priority: High  . Major depression, single episode, in complete remission (HCC) 12/25/2011    Priority: High  . BPPV (benign paroxysmal positional vertigo), unspecified laterality 08/01/2016    Priority: Medium  . IBS (irritable bowel syndrome) 12/25/2011    Priority: Medium  . ETD (Eustachian tube dysfunction), right 08/01/2016    Priority: Low  . Environmental and seasonal allergies 06/04/2016    Priority: Low  . Vitamin D insufficiency 06/04/2016    Priority: Low  . Primary osteoarthritis involving multiple joints 10/28/2014    Priority: Low  . h/o Subclinical hypothyroidism- now N 06/17/2013    Priority: Low  . Numbness 07/25/2016  . Itching 06/04/2016  . PND (post-nasal drip) 06/04/2016  . Hair loss 06/04/2016  . Cold intolerance 06/04/2016  . h/o Tick bites- never tested positive 05/08/2016  . h/o Fatty liver disease, nonalcoholic 05/08/2016  . Perimenopausal 11/15/2014  . Arthritis of carpometacarpal joint 10/04/2013  . Primary insomnia 06/17/2013  . Nonspecific abnormal results of other endocrine function study 05/13/2012    Past Medical history, Surgical history, Family history, Social history, Allergies and Medications have been entered into the medical record, reviewed and changed as needed.   Allergies:  Allergies  Allergen Reactions  . Tramadol Itching  . Savella [Milnacipran] Palpitations    Chest pain    Review of Systems  Constitutional: Positive for malaise/fatigue. Negative for chills, diaphoresis, fever and weight loss.  HENT: Positive for congestion, ear pain and sinus pain. Negative for sore throat and  tinnitus.        "Sinus pressure:  Eyes: Positive for blurred vision. Negative for double vision and photophobia.  Respiratory: Negative.  Negative for cough, shortness of breath and wheezing.   Cardiovascular: Negative for chest pain, palpitations and orthopnea.  Gastrointestinal: Negative.  Negative for blood in stool, diarrhea, nausea and vomiting.  Genitourinary: Negative.  Negative for dysuria and frequency.  Musculoskeletal: Negative.  Negative for falls, joint pain and myalgias.  Skin: Negative.  Negative for itching and rash.  Neurological: Positive for dizziness. Negative for tingling, sensory change, speech change, focal weakness, loss of consciousness, weakness and headaches.       Pulsing in ears  Endo/Heme/Allergies: Negative.  Negative for environmental allergies and polydipsia. Does not bruise/bleed easily.  Psychiatric/Behavioral: Negative.  Negative for depression, memory loss and substance abuse. The patient is not nervous/anxious and does not have insomnia.      Objective:   Blood pressure 109/73, pulse 94, height 5' 3.75" (1.619 m), weight 131 lb 8 oz (59.6 kg). Body mass index is 22.75 kg/m. General: Well Developed, well nourished, appropriate for stated age.  Neuro: Alert and oriented x3, extra-ocular muscles intact, sensation grossly intact. + Modified Hall pikes in office reproducing sx. HEENT:  Normocephalic, atraumatic, neck supple, R TM mild buldge, Nares- red and edematous, clr d/c, no LAD TTP or sinus TTP.   Skin: Warm and dry, no gross rash. Cardiac: RRR, S1 S2,  no murmurs rubs or gallops.  Respiratory: ECTA B/L, Not using accessory muscles, speaking in full sentences-unlabored. Vascular:  No gross lower ext edema, cap RF less 2 sec. Psych: No HI/SI, judgement and insight good, Euthymic mood. Full Affect.

## 2016-08-04 NOTE — Assessment & Plan Note (Addendum)
-   Counseled patient on pathophysiology of disease and discussed various treatment options, which often includes dietary and lifestyle modifications as first line, in addition to discussing the risks and benefits of various medications.     R/B meds d/c pt- consented to meds.  - Anticipatory guidance given. Handouts provided and told pt to do home exercises at least TID  - Encouraged to return to clinic or call the office with any further questions or concerns.

## 2016-08-09 NOTE — Telephone Encounter (Signed)
Tonya please call pt on Friday afternoon to address her most recent question. ( I don't want you to worry about it until we are all done with our clinic patients and you have time).  Please tell pt that I am sure there is re her Q about ENT.  For now, I recommend she cont flonase 1 spray each nostril twice daily- after [facility] or AYR sinus rinses 2-3times/d;    Also she can take dramamine/ or bonine or the like as long as she tolerates it ok for "dizziness".  I recommend she do these home exercises as well.( see below)    If no help or worsens in future, I recommend she call the ENT practice she had seen in the past.    Thanks Tonya.       For pt:   How to Treat Vertigo at Home with Exercises  What is Vertigo?  Vertigo is a relatively common symptom most often associated with conditions such as sinusitis (inflammation of your sinuses due to viruses, allergies, or bacterial infections), or an inner ear infection or ear trauma.   It can be brought on by trauma (e.g. a blow to the head or whiplash) or more serious things like minor strokes.   Symptoms can also be brought on by normal degenerative changes to your inner ear that occur with aging.  The condition tends to be more commonly seen in the elderly but it can occur in all ages.    Patients most often complain of dizziness, as if the room is spinning around them.   Symptoms are provoked by quick head movements or changes in position like going from standing to lying in bed, or even turning over in bed.   It may present with nausea and/or vomiting, and can be very debilitating to some folks.    By far the most common cause, known as Benign Paroxysmal Positional Vertigo (BPPV), is categorized by a sudden onset of symptoms, that are intense but short-lived (60 seconds or less), which is triggered by a change in head position.   Symptoms usually dissipate if you stay in one position and do not move your head.   Within the inner ear are  collections of calcium carbonate crystals referred to as "otoliths" which may become dislodged from their normal position and migrate into the semicircular canals of the inner ear, throwing off your body's ability to sense where you are in space.     Fig. 921 Anatomy of the Right Osseous Labyrinth. Shea StakesHenry Gray. Anatomy of the Human Body. 1918.            What Else Could Be Behind My Vertigo?  Some other causes of vertigo include:  Meniere's disease (disorder of inner ear with ringing in ears, feeling of fullness/pressure within ear, and fluctuating hearing loss) Tumours Neurological disorders e.g. Multiple Sclerosis Motion Sickness (lack of coordination between visual stimuli, inner ear balance and positional sense) Migraine Labyrinthitis (inflammation of the fluid-filled tubes and sacs within the inner ear; may also be associated with changes in hearing) Vestibular neuritis (inflammation of the nerves associated with transmission of sensory info from the inner ear; usually of viral origins)  How it can be treated/cured? While certain medications have been prescribed for vertigo including Lorazepam your doing well 7 house the house going organizing and getting things ready for sale with the and Meclizine (for motion sickness), there exists no evidence to support a recommendation of any medication in the routine treatment of  BPPV.  Clinical trials have demonstrated that repositioning techniques (listed below) are a superior option for management Delia Heady(Fife et al., 2008).    Figure above:  (A) Instructions for the modified Epley procedure (MEP) for left ear posterior canal benign paroxysmal positional vertigo (PC-BPPV). For right ear BPPV, the procedure has to be performed in the opposite direction, starting with the head turned to the right side.  1. Start by sitting on a bed with your head turned 45 to the left. Place a pillow behind you so that on lying back it will be under your shoulders.   2. Lie back quickly with shoulders on the pillow, neck extended, and head resting on the bed. In this position, the affected (left) ear is underneath. Wait for 30 secondS.  3. Turn your head 90 to the right (without raising it), and wait again for 30 seconds.  4. Turn your body and head another 90 to the right, and wait for another 30 seconds.  5. Sit up on the right side. This maneuver should be performed three times a day. Repeat this daily until you are free from positional vertigo for 24 hours.   (B) Instructions for the modified Semont maneuver (MSM) for left ear PC-BPPV. For right ear BPPV, the maneuver has to be performed in the opposite direction, starting with the head turned toward the left ear.  1. Sit upright on a bed with your head turned 45 toward the right ear.  2. Drop quickly to the left side, so that your head touches the bed behind your left ear. Wait 30 seconds.  3. Move head and trunk in a swift movement toward the other side without stopping in the upright position, so that your head comes to rest on the right side of your forehead. Wait again for 30 seconds.  4. Sit up again.  This maneuver should be performed three times a day. Repeat this daily until you are free from positional vertigo symptoms for 24 hours.   (   See the video in the supplementary material on the NeurologyWeb site; go to http://www.neurology.org/content/63/1/150/F1.expansion.html   )     You can also try this motion at home as well- Self-Treatment of Benign Paroxysmal Positional Vertigo Benign Paroxysmal Positioning Vertigo is caused by loose inner ear crystals in the inner ear that migrate while sleeping to the back-bottom inner ear balance canal, the so-called "posterior semi-circular canal." The maneuver demonstrated below is the way to reposition the loose crystals so that the symptoms caused by the loose crystals go away. You may have a floating, swaying sense while walking or sitting for a few  days after this procedure.

## 2016-08-14 ENCOUNTER — Ambulatory Visit: Payer: PRIVATE HEALTH INSURANCE | Admitting: Endocrinology

## 2016-09-25 ENCOUNTER — Telehealth: Payer: Self-pay

## 2016-09-25 NOTE — Telephone Encounter (Signed)
Pt called stating that she has been having abdominal pain x 10 days which has gotten worse the last 2 days.  Pt states that the pain is radiating into her back.  Per Dr. Sharee Holsterpalski, advised pt that she [facility] d/t possibly needing stat labs and imaging.  Pt expressed understanding and is agreeable.  Alicia Harper. Nelson, CMA

## 2016-10-04 ENCOUNTER — Other Ambulatory Visit: Payer: Self-pay | Admitting: Family Medicine

## 2016-10-04 NOTE — Telephone Encounter (Signed)
Historical provider.  Please advise. 

## 2016-10-04 NOTE — Telephone Encounter (Signed)
Called in.

## 2016-11-20 ENCOUNTER — Telehealth: Payer: Self-pay | Admitting: Family Medicine

## 2016-11-20 NOTE — Telephone Encounter (Signed)
Patient is requesting a refill of her lexapro 20mg 

## 2016-11-21 ENCOUNTER — Other Ambulatory Visit: Payer: Self-pay | Admitting: Adult Health

## 2016-11-21 DIAGNOSIS — M797 Fibromyalgia: Secondary | ICD-10-CM

## 2016-11-21 MED ORDER — ESCITALOPRAM OXALATE 20 MG PO TABS: 20.0000 mg | ORAL_TABLET | Freq: Every day | ORAL | 0 refills | Status: AC

## 2016-11-21 NOTE — Telephone Encounter (Signed)
We have not prescribed this medication for the patient before.  Please authorize if appropriate.  Pt was to have RTC in 12/17 and has not had a follow up.  Alicia Harper. Nelson, CMA

## 2016-11-21 NOTE — Telephone Encounter (Signed)
LVM informing pt of need for labs and f/u OV before any further refills.  Tiajuana Amass. Nelson, CMA

## 2016-11-21 NOTE — Telephone Encounter (Signed)
Good Morning Tonya, I have reviewed her chart and it appears that she is taking the Lexapro 20mg  daily for fibromyalgia.   Since Dr. Sharee Holsterpalski cleared indicated that, I will refill. However she has not returned for labs, f/u per Dr. Synthia Innocentpalski's instructions (due 08/2016). So she will receive 30 days of Lexapro only, needs to have f/u for futher refills. Thanks, [employer]

## 2016-11-22 ENCOUNTER — Telehealth: Payer: Self-pay | Admitting: Family Medicine

## 2016-11-22 NOTE — Telephone Encounter (Signed)
Pt called states labs suggested on last visit but she wanted to know if they were fasting labs before establishing an appointment.-- pls call her at [phone]. -glh

## 2016-11-25 ENCOUNTER — Other Ambulatory Visit: Payer: Self-pay

## 2016-11-25 DIAGNOSIS — E039 Hypothyroidism, unspecified: Secondary | ICD-10-CM

## 2016-11-25 DIAGNOSIS — R7303 Prediabetes: Secondary | ICD-10-CM

## 2016-11-25 DIAGNOSIS — E038 Other specified hypothyroidism: Secondary | ICD-10-CM

## 2016-11-25 NOTE — Telephone Encounter (Signed)
Pt informed that she does not need to be fasting for labs.  Pt questioned why she needed OV for refills on Lexapro when she "has been on it for 13 years".  Advised pt that Dr. Sharee Holsterpalski stated in her note from 05/2016 that she was to follow up in 3 month for chronic issues.  Pt stated that she could not follow up with Dr. Sharee Holsterpalski since she is on medical leave and will not return until 12/2016.  Advised pt that Laurance FlattenKaty Bess, NP is following Dr. Synthia Innocentpalski's patients.  Pt stated that she would "have to think about it" and would call back if she wanted to schedule an appointment.  Advised pt a 30 day RX for Lexapro was sent to her pharmacy and that Laurance FlattenKaty Bess, NP would not refill this medication again until she is seen for follow up.  Tiajuana Amass. Nelson, CMA

## 2016-12-19 ENCOUNTER — Other Ambulatory Visit: Payer: Self-pay

## 2016-12-19 NOTE — Telephone Encounter (Signed)
Received RF request from pharmacy for escitalopram.  Called to inform pt that she needs an OV for further refills, but patient states that she does not need any refills for this medication at this time.  RF order cancelled.  Tiajuana Amass. Nelson, CMA

## 2017-02-15 ENCOUNTER — Inpatient Hospital Stay (HOSPITAL_COMMUNITY)
Admission: EM | Admit: 2017-02-15 | Discharge: 2017-02-19 | DRG: 472 | Disposition: A | Discharge status: Home / Self Care | Payer: PRIVATE HEALTH INSURANCE | Attending: Neurosurgery | Admitting: Neurosurgery

## 2017-02-15 ENCOUNTER — Inpatient Hospital Stay (HOSPITAL_COMMUNITY): Payer: PRIVATE HEALTH INSURANCE

## 2017-02-15 ENCOUNTER — Emergency Department (HOSPITAL_COMMUNITY): Payer: PRIVATE HEALTH INSURANCE

## 2017-02-15 ENCOUNTER — Encounter (HOSPITAL_COMMUNITY): Payer: Self-pay | Admitting: Emergency Medicine

## 2017-02-15 DIAGNOSIS — E785 Hyperlipidemia, unspecified: Secondary | ICD-10-CM | POA: Diagnosis present

## 2017-02-15 DIAGNOSIS — F5101 Primary insomnia: Secondary | ICD-10-CM | POA: Diagnosis present

## 2017-02-15 DIAGNOSIS — M533 Sacrococcygeal disorders, not elsewhere classified: Secondary | ICD-10-CM

## 2017-02-15 DIAGNOSIS — S12690A Other displaced fracture of seventh cervical vertebra, initial encounter for closed fracture: Principal | ICD-10-CM | POA: Diagnosis present

## 2017-02-15 DIAGNOSIS — Z419 Encounter for procedure for purposes other than remedying health state, unspecified: Secondary | ICD-10-CM

## 2017-02-15 DIAGNOSIS — M4696 Unspecified inflammatory spondylopathy, lumbar region: Secondary | ICD-10-CM | POA: Diagnosis present

## 2017-02-15 DIAGNOSIS — S32119A Unspecified Zone I fracture of sacrum, initial encounter for closed fracture: Secondary | ICD-10-CM | POA: Diagnosis present

## 2017-02-15 DIAGNOSIS — E039 Hypothyroidism, unspecified: Secondary | ICD-10-CM | POA: Diagnosis present

## 2017-02-15 DIAGNOSIS — R7303 Prediabetes: Secondary | ICD-10-CM | POA: Diagnosis present

## 2017-02-15 DIAGNOSIS — M797 Fibromyalgia: Secondary | ICD-10-CM | POA: Diagnosis present

## 2017-02-15 DIAGNOSIS — M542 Cervicalgia: Secondary | ICD-10-CM | POA: Diagnosis present

## 2017-02-15 DIAGNOSIS — M1991 Primary osteoarthritis, unspecified site: Secondary | ICD-10-CM | POA: Diagnosis present

## 2017-02-15 DIAGNOSIS — R339 Retention of urine, unspecified: Secondary | ICD-10-CM | POA: Diagnosis present

## 2017-02-15 DIAGNOSIS — R079 Chest pain, unspecified: Secondary | ICD-10-CM

## 2017-02-15 DIAGNOSIS — M47812 Spondylosis without myelopathy or radiculopathy, cervical region: Secondary | ICD-10-CM | POA: Diagnosis present

## 2017-02-15 DIAGNOSIS — Z79899 Other long term (current) drug therapy: Secondary | ICD-10-CM

## 2017-02-15 DIAGNOSIS — S12600A Unspecified displaced fracture of seventh cervical vertebra, initial encounter for closed fracture: Secondary | ICD-10-CM | POA: Diagnosis present

## 2017-02-15 DIAGNOSIS — F329 Major depressive disorder, single episode, unspecified: Secondary | ICD-10-CM | POA: Diagnosis present

## 2017-02-15 DIAGNOSIS — R5382 Chronic fatigue, unspecified: Secondary | ICD-10-CM | POA: Diagnosis present

## 2017-02-15 DIAGNOSIS — W11XXXA Fall on and from ladder, initial encounter: Secondary | ICD-10-CM | POA: Diagnosis present

## 2017-02-15 LAB — CBC WITH DIFFERENTIAL/PLATELET
BASOS PCT: 0 %
Basophils Absolute: 0 10*3/uL (ref 0.0–0.1)
Eosinophils Absolute: 0 10*3/uL (ref 0.0–0.7)
Eosinophils Relative: 0 %
HEMATOCRIT: 34.5 % — AB (ref 36.0–46.0)
HEMOGLOBIN: 11.4 g/dL — AB (ref 12.0–15.0)
LYMPHS ABS: 1.2 10*3/uL (ref 0.7–4.0)
LYMPHS PCT: 9 %
MCH: 29 pg (ref 26.0–34.0)
MCHC: 33 g/dL (ref 30.0–36.0)
MCV: 87.8 fL (ref 78.0–100.0)
MONO ABS: 1.1 10*3/uL — AB (ref 0.1–1.0)
MONOS PCT: 8 %
NEUTROS ABS: 11 10*3/uL — AB (ref 1.7–7.7)
NEUTROS PCT: 83 %
Platelets: 277 10*3/uL (ref 150–400)
RBC: 3.93 MIL/uL (ref 3.87–5.11)
RDW: 13.8 % (ref 11.5–15.5)
WBC: 13.2 10*3/uL — ABNORMAL HIGH (ref 4.0–10.5)

## 2017-02-15 LAB — BASIC METABOLIC PANEL
ANION GAP: 8 (ref 5–15)
BUN: 13 mg/dL (ref 6–20)
CHLORIDE: 99 mmol/L — AB (ref 101–111)
CO2: 26 mmol/L (ref 22–32)
Calcium: 8.6 mg/dL — ABNORMAL LOW (ref 8.9–10.3)
Creatinine, Ser: 0.65 mg/dL (ref 0.44–1.00)
GFR calc non Af Amer: >60 mL/min (ref >60)
GLUCOSE: 146 mg/dL — AB (ref 65–99)
POTASSIUM: 3.7 mmol/L (ref 3.5–5.1)
Sodium: 133 mmol/L — ABNORMAL LOW (ref 135–145)

## 2017-02-15 LAB — MRSA PCR SCREENING: MRSA by PCR: NEGATIVE

## 2017-02-15 MED ORDER — SODIUM CHLORIDE 0.9 % IV SOLN
Freq: Once | INTRAVENOUS | Status: AC
  Administered 2017-02-15: 14:00:00 via INTRAVENOUS

## 2017-02-15 MED ORDER — HYDROMORPHONE HCL 1 MG/ML IJ SOLN
0.5000 mg | Freq: Once | INTRAMUSCULAR | Status: AC
  Administered 2017-02-15: 0.5 mg via INTRAVENOUS
  Filled 2017-02-15: qty 1

## 2017-02-15 MED ORDER — MAGNESIUM HYDROXIDE 400 MG/5ML PO SUSP: 30.0000 mL | Freq: Every day | ORAL | Status: DC | PRN

## 2017-02-15 MED ORDER — ESCITALOPRAM OXALATE 10 MG PO TABS
10.0000 mg | ORAL_TABLET | Freq: Every day | ORAL | Status: DC
  Administered 2017-02-15 – 2017-02-19 (×4): 10 mg via ORAL
  Filled 2017-02-15 (×4): qty 1

## 2017-02-15 MED ORDER — ONDANSETRON HCL 4 MG PO TABS: 4.0000 mg | ORAL_TABLET | Freq: Four times a day (QID) | ORAL | Status: DC | PRN

## 2017-02-15 MED ORDER — KCL IN DEXTROSE-NACL 20-5-0.45 MEQ/L-%-% IV SOLN: INTRAVENOUS | Status: DC

## 2017-02-15 MED ORDER — IOPAMIDOL (ISOVUE-300) INJECTION 61%
INTRAVENOUS | Status: AC
  Filled 2017-02-15: qty 100

## 2017-02-15 MED ORDER — FLEET ENEMA 7-19 GM/118ML RE ENEM: 1.0000 | ENEMA | Freq: Once | RECTAL | Status: DC | PRN

## 2017-02-15 MED ORDER — BISACODYL 10 MG RE SUPP: 10.0000 mg | Freq: Every day | RECTAL | Status: DC | PRN

## 2017-02-15 MED ORDER — ONDANSETRON HCL 4 MG/2ML IJ SOLN
4.0000 mg | Freq: Four times a day (QID) | INTRAMUSCULAR | Status: DC | PRN
  Administered 2017-02-15 – 2017-02-16 (×3): 4 mg via INTRAVENOUS
  Filled 2017-02-15 (×3): qty 2

## 2017-02-15 MED ORDER — ZOLPIDEM TARTRATE 5 MG PO TABS: 5.0000 mg | ORAL_TABLET | Freq: Every evening | ORAL | Status: DC | PRN

## 2017-02-15 MED ORDER — HYDROXYZINE HCL 25 MG PO TABS
50.0000 mg | ORAL_TABLET | ORAL | Status: DC | PRN
  Administered 2017-02-16: 50 mg via ORAL
  Filled 2017-02-15: qty 2

## 2017-02-15 MED ORDER — HYDROXYZINE HCL 50 MG/ML IM SOLN
50.0000 mg | INTRAMUSCULAR | Status: DC | PRN
  Filled 2017-02-15: qty 1

## 2017-02-15 MED ORDER — FENTANYL CITRATE (PF) 100 MCG/2ML IJ SOLN
50.0000 ug | Freq: Once | INTRAMUSCULAR | Status: AC
Start: 2017-02-15 — End: 2017-02-15
  Administered 2017-02-15: 50 ug via INTRAVENOUS
  Filled 2017-02-15: qty 2

## 2017-02-15 MED ORDER — MORPHINE SULFATE (PF) 4 MG/ML IV SOLN
4.0000 mg | INTRAVENOUS | Status: DC | PRN
  Administered 2017-02-15 (×2): 8 mg via INTRAMUSCULAR
  Filled 2017-02-15 (×2): qty 2

## 2017-02-15 MED ORDER — IOPAMIDOL (ISOVUE-300) INJECTION 61%
INTRAVENOUS | Status: AC
  Administered 2017-02-15: 100 mL
  Filled 2017-02-15: qty 100

## 2017-02-15 MED ORDER — POTASSIUM CHLORIDE IN NACL 20-0.9 MEQ/L-% IV SOLN
INTRAVENOUS | Status: DC
  Administered 2017-02-15 – 2017-02-16 (×2): via INTRAVENOUS
  Filled 2017-02-15 (×5): qty 1000

## 2017-02-15 NOTE — Progress Notes (Signed)
Patient has a sacral ala fracture only seen on CT.  I have discussed this with Dr. Aundria Rudogers who will see the patient.  Marta LamasJames O. Gae BonWyatt, III, MD, FACS [phone] Trauma Surgeon

## 2017-02-15 NOTE — ED Notes (Signed)
Family requesting pain meds for pt. RN aware of same.

## 2017-02-15 NOTE — ED Provider Notes (Signed)
  Physical Exam  BP 123/78   Pulse 71   Temp 98 F (36.7 C) (Oral)   Resp 18   SpO2 96%   Physical Exam  ED Course  Procedures  MDM 4:19 PM- [facility] PA-C  Per previous provider MDM: Alicia Harper is a 56 y.o. female who presents to ED for evaluation after falling backwards off an 8 foot ladder on to back. Hemodynamically stable. All four extremities NVI. Normal neuro exam. C-collar in place. Complaining of neck and low back pain as well as left hip pain. Plain films of hip/pelvis negative.  Pending CT Cervical and Lumbar   6:36 PM- CT Cervical Spine IMPRESSION:  1. Acute C7 burst type fracture with approximately 15-20% loss of  anterior vertebral body height and mild retropulsion of fracture  fragments slightly narrowing the central spinal canal, as detailed  above.   Consult to Neurosurgery (Dr. Newell CoralNudelman) will see in ED for admission.     Alicia Harper, Alicia Masek, PA-C 02/15/17 Tawana Scale2005    Kohut, Stephen, MD 02/16/17 [phone]

## 2017-02-15 NOTE — Consult Note (Signed)
ORTHOPAEDIC CONSULTATION  REQUESTING PHYSICIAN: Shirlean Kelly, MD  PCP:  Willow Ora, MD  Chief Complaint: Left-sided sacral pain  HPI: Alicia Harper is a 56 y.o. female who complains of cervical spine and lumbar spine and left lower sacral pain following a fall from approximately 8 feet today around noon. She presented to the [facility] emergency department via EMS. CT scans were ordered and reviewed which demonstrated a C7 burst fracture as well as degenerative changes of the lumbar spine with spondylosis and degenerative disc disease as well as some segmental neuroforaminal narrowing. There is also noted to be a left-sided zone 1 sacral fracture that extended into the SI joint.  She denies any lower extremity she denies any lower extremity radiculopathy numbness or weakness. She feels as though the pain is in the left buttock and up into the left side of her lower back. No history of prior lumbar spine or sacral surgery.   Of note she has a past medical history of fibromyalgia elevated A1c and the prediabetic range, thyroid disease as well as hyperlipidemia.  She denies cigarette use or alcoholic beverages. She is married and has 5 children.  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  Past Medical History:  Diagnosis Date  . Arthritis   . Hyperlipidemia   . Insomnia   . Neuromuscular disorder (HCC)    fibromyalgia  . Thyroid disease    History reviewed. No pertinent surgical history. Social History   Social History  . Marital status: Married    Spouse name: N/A  . Number of children: N/A  . Years of education: N/A   Social History Main Topics  . Smoking status: Never Smoker  . Smokeless tobacco: Never Used  . Alcohol use No  . Drug use: No  . Sexual activity: Yes    Birth control/ protection: None   Other Topics Concern  . None   Social History  Narrative  . None   Family History  Problem Relation Age of Onset  . Cancer Father        kidney  . Hypertension Father   . Hypothyroidism Father   . Hypertension Maternal Grandmother   . Stroke Maternal Grandmother   . Stroke Maternal Grandfather   . Stroke Paternal Grandmother   . Heart attack Paternal Grandfather   . Hypothyroidism Mother   . Hypothyroidism Sister    Allergies  Allergen Reactions  . Tramadol Itching  . Savella [Milnacipran] Palpitations and Other (See Comments)    Chest pain   Prior to Admission medications   Medication Sig Start Date End Date Taking? Authorizing Provider  Ascorbic Acid (VITAMIN C PO) Take 1 tablet by mouth daily.   Yes [provider]  Cholecalciferol (VITAMIN D3) 1000 units CAPS Take 1,000 Units by mouth daily.    Yes [provider]  escitalopram (LEXAPRO) 20 MG tablet Take 1 tablet (20 mg total) by mouth daily. 1/2 Patient taking differently: Take 10 mg by mouth daily. 1/2 11/21/16  Yes Danford, Orpha Bur D, NP  fluticasone (FLONASE) 50 MCG/ACT nasal spray Place 2 sprays into both nostrils daily as needed for allergies.    Yes [provider]  gentamicin ointment (GARAMYCIN) 0.1 % Apply 1 application topically 3 (three) times daily. 12/23/16  Yes [provider]  HYDROcodone-acetaminophen (NORCO/VICODIN) 5-325 MG tablet Take 0.5 tablets by mouth every 6 (six)  hours as needed for moderate pain.  03/29/16  Yes [provider]  MAGNESIUM PO Take 1 capsule by mouth daily.   Yes [provider]  methylPREDNISolone (MEDROL) 4 MG tablet Take 4-24 mg by mouth See admin instructions. Take 24 mg by mouth on day 1, then take 20 mg by mouth on day 2, then take 16 mg by mouth on day 3, then take 12 mg by mouth on day 4, then take 8 mg by mouth on day 5, then take 4 mg by mouth on day 6. 02/14/17  Yes [provider]  Omega-3 Fatty Acids (FISH OIL PO) Take 1 capsule by mouth daily.   Yes [provider]  Turmeric 450 MG CAPS Take 450 mg by mouth 2 (two) times daily.    Yes [provider]  zolpidem (AMBIEN) 10 MG tablet TAKE 1 TABLET AT BEDTIME AS NEEDED FOR SLEEP Patient taking differently: Take 5-10 mg by mouth at bedtime as needed for sleep 10/04/16  Yes Opalski, Gavin Poundeborah, DO  predniSONE (DELTASONE) 50 MG tablet Take 1 tablet (50 mg total) by mouth daily. Patient not taking: Reported on 02/15/2017 08/01/16   Thomasene Lotpalski, Deborah, DO   Ct Chest W Contrast  Result Date: 02/15/2017 CLINICAL DATA:  Acute onset of generalized chest pain after fall from 8 foot ladder. Concern for chest or abdominal injury. Initial encounter. EXAM: CT CHEST, ABDOMEN, AND PELVIS WITH CONTRAST TECHNIQUE: Multidetector CT imaging of the chest, abdomen and pelvis was performed following the standard protocol during bolus administration of intravenous contrast. CONTRAST:  100mL ISOVUE-300 IOPAMIDOL (ISOVUE-300) INJECTION 61% COMPARISON:  CT of the lumbar spine performed earlier today at 6:07 p.m. FINDINGS: CT CHEST FINDINGS Cardiovascular: The heart is normal in size. There is no evidence of aortic injury. The thoracic aorta is unremarkable in appearance. The great vessels are within normal limits. There is no evidence of venous hemorrhage. Mediastinum/Nodes: A tiny hiatal hernia is noted. The mediastinum is otherwise unremarkable. No mediastinal lymphadenopathy is seen. No pericardial effusion is identified. The visualized portions of the thyroid gland are unremarkable. No axillary lymphadenopathy is seen. Lungs/Pleura: Mild bibasilar atelectasis is noted. The lungs are otherwise clear. There is no evidence of pulmonary parenchymal contusion. No pleural effusion or pneumothorax is seen. No masses are identified. Musculoskeletal: No acute osseous abnormalities are identified. The visualized musculature is unremarkable in appearance. CT ABDOMEN PELVIS FINDINGS Hepatobiliary: The liver is unremarkable in appearance.  The patient is status post cholecystectomy, with clips noted at the gallbladder fossa. The common bile duct remains normal in caliber. Pancreas: The pancreas is within normal limits. Spleen: The spleen is unremarkable in appearance. Adrenals/Urinary Tract: The adrenal glands are unremarkable in appearance. A left renal cyst is noted. There is no evidence of hydronephrosis. No renal or ureteral stones are identified. No perinephric stranding is seen. Stomach/Bowel: The stomach is unremarkable in appearance. The small bowel is within normal limits. The appendix is not visualized; there is no evidence for appendicitis. The cecum is flipped anteriorly, noted at the mid lower abdomen. The colon is unremarkable in appearance. Vascular/Lymphatic: The abdominal aorta is unremarkable in appearance. The inferior vena cava is grossly unremarkable. No retroperitoneal lymphadenopathy is seen. No pelvic sidewall lymphadenopathy is identified. Reproductive: The bladder is decompressed, with a Foley catheter in place. The uterus is unremarkable in appearance. The ovaries are relatively symmetric. No suspicious adnexal masses are seen. Other: No additional soft tissue abnormalities are seen. Musculoskeletal: The fracture through the inferior aspect of the  left sacral ala, extending to the left sacroiliac joint, and the S3 vertebral body and posterior elements, is better characterized on recent CT of the lumbar spine. The visualized musculature is unremarkable in appearance. IMPRESSION: 1. Fracture through the inferior aspect of the left sacral ala, extending to the left sacroiliac joint, and the S3 vertebral body and posterior elements, is better characterized on recent CT of the lumbar spine. 2. No additional evidence of traumatic injury to the chest, abdomen or pelvis. 3. Tiny hiatal hernia noted. 4. Mild bibasilar atelectasis noted.  Lungs otherwise clear. 5. Left renal cyst noted. Electronically Signed   By: Roanna Raider M.D.    On: 02/15/2017 22:29   Ct Cervical Spine Wo Contrast  Result Date: 02/15/2017 CLINICAL DATA:  56 year old female with history of trauma from a fall from a 8 foot ladder landing on toward back. EXAM: CT CERVICAL SPINE WITHOUT CONTRAST TECHNIQUE: Multidetector CT imaging of the cervical spine was performed without intravenous contrast. Multiplanar CT image reconstructions were also generated. COMPARISON:  No priors. FINDINGS: Alignment: Anatomic. Skull base and vertebrae: Acute burst type fracture of C7 vertebral body with approximately 15-20% loss of anterior vertebral body height, and mild retropulsion of fracture fragments at this level narrowing the central spinal canal to 14 mm. No other acute displaced fractures are noted. Soft tissues and spinal canal: Prevertebral soft tissues are normal. Disc levels: Mild multilevel degenerative disc disease, most severe at C5-C6. Mild multilevel facet arthropathy. Upper chest: Unremarkable. Other: None. IMPRESSION: 1. Acute C7 burst type fracture with approximately 15-20% loss of anterior vertebral body height and mild retropulsion of fracture fragments slightly narrowing the central spinal canal, as detailed above. 2. Mild multilevel degenerative disc disease and cervical spondylosis, as above. These results were called by telephone at the time of interpretation on 02/15/2017 at 6:31 pm to Dr. Dalene Seltzer, who verbally acknowledged these results. Electronically Signed   By: Trudie Reed M.D.   On: 02/15/2017 18:32   Ct Lumbar Spine Wo Contrast  Addendum Date: 02/15/2017   ADDENDUM REPORT: 02/15/2017 20:50 ADDENDUM: Nondisplaced S3 vertebral body and posterior element fracture extending out laterally into the left inferior sacrum towards the left sacroiliac joint. Electronically Signed   By: Elige Ko   On: 02/15/2017 20:50   Result Date: 02/15/2017 CLINICAL DATA:  Status post fall from 8 foot ladder.  Low back pain. EXAM: CT LUMBAR SPINE WITHOUT CONTRAST  TECHNIQUE: Multidetector CT imaging of the lumbar spine was performed without intravenous contrast administration. Multiplanar CT image reconstructions were also generated. COMPARISON:  None. FINDINGS: Segmentation: 5 lumbar type vertebrae. Alignment: Normal. Vertebrae: No acute fracture or focal pathologic process. Paraspinal and other soft tissues: Negative. Disc levels: Disc spaces: Disc heights are maintained. T12-L1: No significant disc bulge. No evidence of neural foraminal stenosis. No central canal stenosis. L1-L2: No significant disc bulge. No evidence of neural foraminal stenosis. No central canal stenosis. L2-L3: No significant disc bulge. No evidence of neural foraminal stenosis. No central canal stenosis. L3-L4: No significant disc bulge. No evidence of neural foraminal stenosis. No central canal stenosis. L4-L5: Right foraminal disc osteophyte complex mildly narrowing the right neural foramen. No left foraminal narrowing. Mild right facet arthropathy. No central canal stenosis. L5-S1: No significant disc bulge. No evidence of neural foraminal stenosis. No central canal stenosis. Moderate right and mild left facet arthropathy. IMPRESSION: 1. No acute osseous injury of the lumbar spine. Electronically Signed: By: Elige Ko On: 02/15/2017 18:30   Mr Cervical Spine Wo  Contrast  Result Date: 02/15/2017 CLINICAL DATA:  Follow-up C7 burst fracture after fall from ladder at noon today. EXAM: MRI CERVICAL SPINE WITHOUT CONTRAST TECHNIQUE: Multiplanar, multisequence MR imaging of the cervical spine was performed. No intravenous contrast was administered. COMPARISON:  CT cervical spine Feb 15, 2017 1803 hours FINDINGS: ALIGNMENT: Maintained cervical lordosis.  No malalignment. VERTEBRAE/DISCS: Acute C7 burst fracture with 2 mm retropulsed bony fragments, less than 25 percent superior endplate height loss. Remaining cervical vertebral bodies intact. Moderate C5-6 disc height loss with mild chronic  discogenic endplate changes C3-4 through C5-6. Bright STIR signal within the C6-7 disc compatible with edema. CORD:Cervical spinal cord is normal morphology and signal characteristics from the cervicomedullary junction to level of T3-4, the most caudal well visualized level. No susceptibility artifact to suggest hemorrhage. POSTERIOR FOSSA, VERTEBRAL ARTERIES, PARASPINAL TISSUES: Minimally increased STIR signal within the interspinous space C5-6, C6-7. No ligamentous disruption. Abnormal intermediate signal within the bilateral mid V2 segments. Bright interstitial STIR signal bilateral paraspinal muscles from C2 through C7. DISC LEVELS: C2-3: No disc bulge, canal stenosis nor neural foraminal narrowing. C3-4: Uncovertebral hypertrophy. Severe RIGHT, mild LEFT facet arthropathy. No canal stenosis. Mild RIGHT neural foraminal narrowing. C4-5: Uncovertebral hypertrophy. Moderate RIGHT facet arthropathy. No canal stenosis or neural foraminal narrowing. C5-6: Small broad-based disc bulge, uncovertebral hypertrophy toward the LEFT and minimal facet arthropathy. No canal stenosis. Moderate to severe LEFT neural foraminal narrowing. C6-7: Retropulsed bony fragments, slightly effaced ventral thecal sac without canal stenosis or neural foraminal narrowing. C7-T1: No disc bulge, canal stenosis nor neural foraminal narrowing. Severe RIGHT facet arthropathy. IMPRESSION: Acute mild C7 burst fracture. No cord contusion or ligamentous disruption. Mid interspinous ligament sprain and low to mid grade paraspinal muscle strain. No canal stenosis. Moderate to severe LEFT C5-6 neural foraminal narrowing. Slow flow versus 3 T artifact bilateral vertebral artery's, less likely acute injury though, CT angiogram of the neck would be more sensitive. Electronically Signed   By: Awilda Metro M.D.   On: 02/15/2017 22:04   Ct Abdomen Pelvis W Contrast  Result Date: 02/15/2017 CLINICAL DATA:  Acute onset of generalized chest pain after  fall from 8 foot ladder. Concern for chest or abdominal injury. Initial encounter. EXAM: CT CHEST, ABDOMEN, AND PELVIS WITH CONTRAST TECHNIQUE: Multidetector CT imaging of the chest, abdomen and pelvis was performed following the standard protocol during bolus administration of intravenous contrast. CONTRAST:  ISOVUE-300 IOPAMIDOL (ISOVUE-300) INJECTION 61% COMPARISON:  CT of the lumbar spine performed earlier today at 6:07 p.m. FINDINGS: CT CHEST FINDINGS Cardiovascular: The heart is normal in size. There is no evidence of aortic injury. The thoracic aorta is unremarkable in appearance. The great vessels are within normal limits. There is no evidence of venous hemorrhage. Mediastinum/Nodes: A tiny hiatal hernia is noted. The mediastinum is otherwise unremarkable. No mediastinal lymphadenopathy is seen. No pericardial effusion is identified. The visualized portions of the thyroid gland are unremarkable. No axillary lymphadenopathy is seen. Lungs/Pleura: Mild bibasilar atelectasis is noted. The lungs are otherwise clear. There is no evidence of pulmonary parenchymal contusion. No pleural effusion or pneumothorax is seen. No masses are identified. Musculoskeletal: No acute osseous abnormalities are identified. The visualized musculature is unremarkable in appearance. CT ABDOMEN PELVIS FINDINGS Hepatobiliary: The liver is unremarkable in appearance. The patient is status post cholecystectomy, with clips noted at the gallbladder fossa. The common bile duct remains normal in caliber. Pancreas: The pancreas is within normal limits. Spleen: The spleen is unremarkable in appearance. Adrenals/Urinary Tract: The adrenal  glands are unremarkable in appearance. A left renal cyst is noted. There is no evidence of hydronephrosis. No renal or ureteral stones are identified. No perinephric stranding is seen. Stomach/Bowel: The stomach is unremarkable in appearance. The small bowel is within normal limits. The appendix is not  visualized; there is no evidence for appendicitis. The cecum is flipped anteriorly, noted at the mid lower abdomen. The colon is unremarkable in appearance. Vascular/Lymphatic: The abdominal aorta is unremarkable in appearance. The inferior vena cava is grossly unremarkable. No retroperitoneal lymphadenopathy is seen. No pelvic sidewall lymphadenopathy is identified. Reproductive: The bladder is decompressed, with a Foley catheter in place. The uterus is unremarkable in appearance. The ovaries are relatively symmetric. No suspicious adnexal masses are seen. Other: No additional soft tissue abnormalities are seen. Musculoskeletal: The fracture through the inferior aspect of the left sacral ala, extending to the left sacroiliac joint, and the S3 vertebral body and posterior elements, is better characterized on recent CT of the lumbar spine. The visualized musculature is unremarkable in appearance. IMPRESSION: 1. Fracture through the inferior aspect of the left sacral ala, extending to the left sacroiliac joint, and the S3 vertebral body and posterior elements, is better characterized on recent CT of the lumbar spine. 2. No additional evidence of traumatic injury to the chest, abdomen or pelvis. 3. Tiny hiatal hernia noted. 4. Mild bibasilar atelectasis noted.  Lungs otherwise clear. 5. Left renal cyst noted. Electronically Signed   By: Roanna Raider M.D.   On: 02/15/2017 22:29   Dg Chest Portable 1 View  Result Date: 02/15/2017 CLINICAL DATA:  Preop evaluation EXAM: PORTABLE CHEST 1 VIEW COMPARISON:  None. FINDINGS: The heart size and mediastinal contours are within normal limits. Both lungs are clear. The visualized skeletal structures are unremarkable. IMPRESSION: No active disease. Electronically Signed   By: Alcide Clever M.D.   On: 02/15/2017 19:33   Dg Hip Unilat W Or Wo Pelvis 2-3 Views Left  Result Date: 02/15/2017 CLINICAL DATA:  Posterior left-sided hip pain after fall from ladder today. EXAM: DG HIP  (WITH OR WITHOUT PELVIS) 2-3V LEFT COMPARISON:  None. FINDINGS: There is no evidence of hip fracture or dislocation. There is no evidence of arthropathy or other focal bone abnormality. IMPRESSION: Negative. Electronically Signed   By: Gerome Sam III M.D   On: 02/15/2017 14:59    Positive ROS: All other systems have been reviewed and were otherwise negative with the exception of those mentioned in the HPI and as above.  Physical Exam: General: Alert, no acute distress Cardiovascular: No pedal edema Respiratory: No cyanosis, no use of accessory musculature GI: No organomegaly, abdomen is soft and non-tender Skin: No lesions in the area of chief complaint Neurologic: Sensation intact distally Psychiatric: Patient is competent for consent with normal mood and affect Lymphatic: No axillary or cervical lymphadenopathy  MUSCULOSKELETAL:   Physical examination of pelvic ring demonstrates no open wounds. She has no pain with lateral or AP compression of the pelvis.  Negative pain with logroll of the left lower extremity negative pain with Stinchfield maneuver. She does have tenderness to palpation at the left SI joint and along the left gluteus muscle. Distally she has sensation intact to light touch in L2-S1 nerve distributions. She also has good 5 out of 5 strength with hip flexion and knee extension, dorsiflexion of the foot extension of the great toe, plantar flexion of the foot. She has symmetric 2+ patellar tendon reflexes. Foot is warm and well-perfused on the left side  with 2+ dorsalis pedis pulse.  Assessment: Left zone 1 sacral alar fracture with extension across the SI joint  Plan: -We will plan for nonoperative management of this fracture with weightbearing as tolerated to bilateral lower extremities. -I reviewed the CT scan which is negative for any anterior pelvic ring fractures and thus this can safely be managed nonoperatively. -I will plan on following up Mrs. Gervais in my  office after discharge from the hospital with x-rays. -PT/OT when patient able from a neurosurgical standpoint. -Will sign off for now please call with questions. -DVT prophylaxis per primary team.   Yolonda Kida, MD Cell [phone]    02/15/2017 11:28 PM

## 2017-02-15 NOTE — H&P (Signed)
Subjective: Patient is a 56 y.o. white female who is admitted for treatment of C7 burst fracture.  Patient fell from an extension ladder, about 8 feet, at about noon today. [facility] by EMS [facility] and evaluated. Patient had persistent complaints of neck, low back and left sacral pain. CT of the cervical spine and lumbar spine were obtained. CT of the cervical spine showed multilevel cervical spondylosis and degenerative disease (patient was aware of these degenerative changes), but most significant was showed a acute C7 burst fracture with mild retropulsion of the posterior superior aspect of C7. CT of the lumbar spine showed degenerative changes on the right, including facet arthropathy at L3-4, L4-5, and L5-S1, with some right L4-5 neural foraminal narrowing, but most significantly shows a acute fracture involving the sacrum, extending across the left SI joint, and extending from the left side of the sacrum caudally and to the right.  Symptomatically the patient is complaining of pain in the medial aspect the left buttock, that with movement causes pain to extend up to the left side of her back. She is immobilized in a trauma cervical collar and has no neck pain without movement, but the neck does hurt with movement. She also describes neck feels tight. She denies any weakness in the extremities or any tingling through the upper or lower extremities, or in her hands or feet. Recently she's been having some discomfort radiating around the thoracic torso. She feels that her bladder is full.  She has notable history of fibromyalgia. She notes that she's been told that she is prediabetic, with a mildly elevated hemoglobin A1c, but denies any history of hypertension, heart disease, cancer, stroke, peptic ulcers, or lung disease. Previous surgeries include cholecystectomy and 2 cesarean sections.   She does not smoke nor does she drink alcohol beverages. 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  She is married, and has 5 children including a 54 year old and 4 children in their 5120s.   Patient Active Problem List   Diagnosis Date Noted  . C7 cervical fracture (HCC) 02/15/2017  . ETD (Eustachian tube dysfunction), right 08/01/2016  . BPPV (benign paroxysmal positional vertigo), unspecified laterality 08/01/2016  . Numbness 07/25/2016  . Itching 06/04/2016  . PND (post-nasal drip) 06/04/2016  . Environmental and seasonal allergies 06/04/2016  . Vitamin D insufficiency 06/04/2016  . Prediabetes 06/04/2016  . Chronic fatigue 06/04/2016  . Hair loss 06/04/2016  . Cold intolerance 06/04/2016  . h/o Tick bites- never tested positive 05/08/2016  . h/o Fatty liver disease, nonalcoholic 05/08/2016  . Perimenopausal 11/15/2014  . Primary osteoarthritis involving multiple joints 10/28/2014  . Arthritis of carpometacarpal joint 10/04/2013  . Fibromyalgia 07/07/2013  . Primary insomnia 06/17/2013  . h/o Subclinical hypothyroidism- now N 06/17/2013  . Nonspecific abnormal results of other endocrine function study 05/13/2012  . IBS (irritable bowel syndrome) 12/25/2011  . Major depression, single episode, in complete remission (HCC) 12/25/2011   Past Medical History:  Diagnosis Date  . Arthritis   . Hyperlipidemia   . Insomnia   . Neuromuscular disorder (HCC)    fibromyalgia  . Thyroid disease     History reviewed. No pertinent surgical history.   (Not in a hospital admission) Allergies  Allergen Reactions  . Tramadol Itching  . Savella [Milnacipran] Palpitations and Other (See Comments)    Chest pain    Social History  Substance Use Topics  . Smoking  status: Never Smoker  . Smokeless tobacco: Never Used  . Alcohol use No    Family History  Problem Relation Age of Onset  . Cancer Father        kidney  . Hypertension Father   . Hypothyroidism Father   . Hypertension Maternal Grandmother   .  Stroke Maternal Grandmother   . Stroke Maternal Grandfather   . Stroke Paternal Grandmother   . Heart attack Paternal Grandfather   . Hypothyroidism Mother   . Hypothyroidism Sister      Review of Systems Notable for those complaints of any issues in her history of present illness and past medical history, but is otherwise unremarkable.  Objective: Vital signs in last 24 hours: Temp:  [98 F (36.7 C)] 98 F (36.7 C) (05/26 1338) Pulse Rate:  [71-87] 80 (05/26 2000) Resp:  [16-18] 16 (05/26 1730) BP: (107-124)/(54-80) 124/64 (05/26 2000) SpO2:  [95 %-97 %] 97 % (05/26 2000)  EXAM: Well-developed well-nourished white female, immobilized in a trauma cervical collar, in discomfort, but no acute distress. Lungs are clear to auscultation , the patient has symmetrical respiratory excursion. Heart has a regular rate and rhythm normal S1 and S2 no murmur.   Abdomen is soft nontender nondistended bowel sounds are present. Extremity examination shows no clubbing cyanosis or edema. Neurologic examination: Mental status shows the patient is awake, alert, fully oriented. Following commands. Speech fluent. Cranial nerve examination shows pupils are equal round react light, EOMI, facial movements symmetrical. Motor examination shows no drift of the upper extremities. There is 5/5 strength in the upper and lower extremities including the deltoid, biceps, triceps, intrinsics, grip, iliopsoas, quadriceps, dorsiflexor, EHL, and plantar flexor bilaterally. Sensation intact to pinprick to the distal upper and lower extremities. Reflexes are 2+ at the biceps, brachioradialis, triceps, quadriceps, and gastrocnemius bilaterally. Gait and stance are not tested due to the nature the patient's injuries.  Data Review:CBC    Component Value Date/Time   WBC 13.2 (H) 02/15/2017 1920   RBC 3.93 02/15/2017 1920   HGB 11.4 (L) 02/15/2017 1920   HCT 34.5 (L) 02/15/2017 1920   PLT 277 02/15/2017 1920   MCV 87.8  02/15/2017 1920   MCH 29.0 02/15/2017 1920   MCHC 33.0 02/15/2017 1920   RDW 13.8 02/15/2017 1920   LYMPHSABS 1.2 02/15/2017 1920   MONOABS 1.1 (H) 02/15/2017 1920   EOSABS 0.0 02/15/2017 1920   BASOSABS 0.0 02/15/2017 1920                          BMET    Component Value Date/Time   NA 133 (L) 02/15/2017 1920   NA 142 04/06/2014   K 3.7 02/15/2017 1920   CL 99 (L) 02/15/2017 1920   CO2 26 02/15/2017 1920   GLUCOSE 146 (H) 02/15/2017 1920   BUN 13 02/15/2017 1920   BUN 15 04/06/2014   CREATININE 0.65 02/15/2017 1920   CREATININE 0.81 05/21/2016 0840   CALCIUM 8.6 (L) 02/15/2017 1920   CALCIUM 9.8 04/06/2014   GFRNONAA >60 02/15/2017 1920   GFRAA >60 02/15/2017 1920     Assessment/Plan: Patient who fell about 8 feet from a ladder earlier today, who has neck, low back, and left sacral pain. Workup so far as revealed degenerative disease and spondylosis in both the cervical and lumbar spine, but acute injuries including a C7 burst fracture as well as a sacral fracture that extends across this left sacroiliac joint. Neurologic examination shows  intact strength and sensation in the upper and lower extremities.  I spoke with the patient and her husband John at length at the bedside and of recommended admission to the neurosurgical intensive care unit. I explained the patient will need a C7 corpectomy and a C6-T1 anterior cervical arthrodesis with bone graft and plating. However I've recommended that we obtain further workup including CT scans of the chest, abdomen, and pelvis, as well as MRI scan of the cervical spine.  We will have the patient changed from the trauma cervical collar to a Aspen cervical collar. We'll have a Foley catheter placed for acute urinary retention. We will use SCDs for VTE prophylaxis due to anticipated surgery within the next 24-48 hours.   Hewitt Shorts, MD 02/15/2017 8:32 PM

## 2017-02-15 NOTE — ED Provider Notes (Signed)
[facility]-EMERGENCY DEPT Provider Note   CSN: [id number] Arrival date & time: 02/15/17  1333     History   Chief Complaint Chief Complaint  Patient presents with  . Fall    HPI Alicia Harper is a 56 y.o. female.  The history is provided by the patient and medical records. No language interpreter was used.   Alicia Harper is a 56 y.o. female who presents to the Emergency Department complaining of neck pain, low back pain and left sided hip pain which began acutely just prior to arrival after falling off an 8 foot ladder. The ladder slipped, causing her to fall backwards. She landed on her back, almost flat, but denies hitting head. No LOC. No bleeding anywhere. Nothing fell on her. She received 50 mg fentanyl x 3 for pain by EMS en route. No prior spine/hip injuries. Pain worse with certain movements. Better when still. No fever, saddle anesthesia, weakness, numbness, urinary complaints including retention/incontinence.   Past Medical History:  Diagnosis Date  . Arthritis   . Hyperlipidemia   . Insomnia   . Neuromuscular disorder (HCC)    fibromyalgia  . Thyroid disease     Patient Active Problem List   Diagnosis Date Noted  . ETD (Eustachian tube dysfunction), right 08/01/2016  . BPPV (benign paroxysmal positional vertigo), unspecified laterality 08/01/2016  . Numbness 07/25/2016  . Itching 06/04/2016  . PND (post-nasal drip) 06/04/2016  . Environmental and seasonal allergies 06/04/2016  . Vitamin D insufficiency 06/04/2016  . Prediabetes 06/04/2016  . Chronic fatigue 06/04/2016  . Hair loss 06/04/2016  . Cold intolerance 06/04/2016  . h/o Tick bites- never tested positive 05/08/2016  . h/o Fatty liver disease, nonalcoholic 05/08/2016  . Perimenopausal 11/15/2014  . Primary osteoarthritis involving multiple joints 10/28/2014  . Arthritis of carpometacarpal joint 10/04/2013  . Fibromyalgia 07/07/2013  . Primary insomnia 06/17/2013  . h/o Subclinical hypothyroidism- now N  06/17/2013  . Nonspecific abnormal results of other endocrine function study 05/13/2012  . IBS (irritable bowel syndrome) 12/25/2011  . Major depression, single episode, in complete remission (HCC) 12/25/2011    History reviewed. No pertinent surgical history.  OB History    No data available       Home Medications    Prior to Admission medications   Medication Sig Start Date End Date Taking? Authorizing Provider  Ascorbic Acid (VITAMIN C PO) Take 1 tablet by mouth daily.   Yes [provider]  Cholecalciferol (VITAMIN D3) 1000 units CAPS Take 1,000 Units by mouth daily.    Yes [provider]  escitalopram (LEXAPRO) 20 MG tablet Take 1 tablet (20 mg total) by mouth daily. 1/2 Patient taking differently: Take 10 mg by mouth daily. 1/2 11/21/16  Yes Danford, Orpha Bur D, NP  fluticasone (FLONASE) 50 MCG/ACT nasal spray Place 2 sprays into both nostrils daily as needed for allergies.    Yes [provider]  gentamicin ointment (GARAMYCIN) 0.1 % Apply 1 application topically 3 (three) times daily. 12/23/16  Yes [provider]  HYDROcodone-acetaminophen (NORCO/VICODIN) 5-325 MG tablet Take 0.5 tablets by mouth every 6 (six) hours as needed for moderate pain.  03/29/16  Yes [provider]  MAGNESIUM PO Take 1 capsule by mouth daily.   Yes [provider]  methylPREDNISolone (MEDROL) 4 MG tablet Take 4-24 mg by mouth See admin instructions. Take 24 mg by mouth on day 1, then take 20 mg by mouth on day 2, then take 16 mg by mouth on day  3, then take 12 mg by mouth on day 4, then take 8 mg by mouth on day 5, then take 4 mg by mouth on day 6. 02/14/17  Yes [provider]  Omega-3 Fatty Acids (FISH OIL PO) Take 1 capsule by mouth daily.   Yes [provider]  Turmeric 450 MG CAPS Take 450 mg by mouth 2 (two) times daily.    Yes [provider]  zolpidem (AMBIEN) 10 MG tablet TAKE 1 TABLET AT BEDTIME AS NEEDED FOR  SLEEP Patient taking differently: Take 5-10 mg by mouth at bedtime as needed for sleep 10/04/16  Yes Opalski, Gavin Poundeborah, DO  predniSONE (DELTASONE) 50 MG tablet Take 1 tablet (50 mg total) by mouth daily. Patient not taking: Reported on 02/15/2017 08/01/16   Thomasene Lotpalski, Deborah, DO    Family History Family History  Problem Relation Age of Onset  . Cancer Father        kidney  . Hypertension Father   . Hypothyroidism Father   . Hypertension Maternal Grandmother   . Stroke Maternal Grandmother   . Stroke Maternal Grandfather   . Stroke Paternal Grandmother   . Heart attack Paternal Grandfather   . Hypothyroidism Mother   . Hypothyroidism Sister     Social History Social History  Substance Use Topics  . Smoking status: Never Smoker  . Smokeless tobacco: Never Used  . Alcohol use No     Allergies   Tramadol and Savella [milnacipran]   Review of Systems Review of Systems  Musculoskeletal: Positive for back pain and neck pain.  Skin: Negative for color change and wound.  Neurological: Negative for syncope, weakness and numbness.  All other systems reviewed and are negative.    Physical Exam Updated Vital Signs BP (!) 110/54   Pulse 79   Temp 98 F (36.7 C) (Oral)   Resp 16   SpO2 96%   Physical Exam  Constitutional: She is oriented to person, place, and time. She appears well-developed and well-nourished. No distress.  HENT:  Head: Normocephalic and atraumatic.  Neck:  C-collar in place. + midline tenderness.  Cardiovascular: Normal rate, regular rhythm and normal heart sounds.   No murmur heard. Pulmonary/Chest: Effort normal and breath sounds normal. No respiratory distress.  Abdominal: Soft. She exhibits no distension. There is no tenderness.  Musculoskeletal:  + midline tenderness of lower L spine.  Tenderness along left lumbar paraspinal musculature and left lateral hip.  Straight leg raises negative bilaterally. 5/5 muscle strength in all four extremities.   Neurological: She is alert and oriented to person, place, and time.  Speech clear and goal oriented. CN 2-12 grossly intact. Normal finger-to-nose and rapid alternating movements. No drift. All four extremities NVI.  Skin: Skin is warm and dry.  No erythema or open wounds.  Nursing note and vitals reviewed.    ED Treatments / Results  Labs (all labs ordered are listed, but only abnormal results are displayed) Labs Reviewed - No data to display  EKG  EKG Interpretation None       Radiology Dg Hip Unilat W Or Wo Pelvis 2-3 Views Left  Result Date: 02/15/2017 CLINICAL DATA:  Posterior left-sided hip pain after fall from ladder today. EXAM: DG HIP (WITH OR WITHOUT PELVIS) 2-3V LEFT COMPARISON:  None. FINDINGS: There is no evidence of hip fracture or dislocation. There is no evidence of arthropathy or other focal bone abnormality. IMPRESSION: Negative. Electronically Signed   By: Gerome Samavid  Williams III M.D   On: 02/15/2017  14:59    Procedures Procedures (including critical care time)  Medications Ordered in ED Medications  HYDROmorphone (DILAUDID) injection 0.5 mg (0.5 mg Intravenous Given 02/15/17 1417)  0.9 %  sodium chloride infusion ( Intravenous Stopped 02/15/17 1800)  fentaNYL (SUBLIMAZE) injection 50 mcg (50 mcg Intravenous Given 02/15/17 1627)     Initial Impression / Assessment and Plan / ED Course  I have reviewed the triage vital signs and the nursing notes.  Pertinent labs & imaging results that were available during my care of the patient were reviewed by me and considered in my medical decision making (see chart for details).    Alicia Harper is a 56 y.o. female who presents to ED for evaluation after falling backwards off an 8 foot ladder onto back. Hemodynamically stable. All four extremities NVI. Normal neuro exam. C-collar in place. Complaining of neck and low back pain as well as left hip pain. Plain films of hip/pelvis negative. CT cervical and lumbar pending at  shift change. If CT images are negative and patient able to ambulate, dc home.   Final Clinical Impressions(s) / ED Diagnoses   Final diagnoses:  None    New Prescriptions New Prescriptions   No medications on file     Elka Satterfield, Chase Picket, PA-C 02/15/17 1805    Raeford Razor, MD 02/16/17 [phone]

## 2017-02-15 NOTE — ED Triage Notes (Signed)
Patient present today from home with complaints of Fall off 8 foot latter. Patient reports she fell on back. Patient reports pain on left hip and sacrum. Patient alert and oriented on arrival. Pulse intake. Patient denies any sensation changes. Patient able to move toes on arrival. C-collar  placed by EMS.

## 2017-02-15 NOTE — ED Notes (Signed)
Report given to Gabe RN

## 2017-02-15 NOTE — ED Notes (Signed)
Pt taken to MRI  

## 2017-02-16 ENCOUNTER — Inpatient Hospital Stay (HOSPITAL_COMMUNITY): Payer: PRIVATE HEALTH INSURANCE

## 2017-02-16 ENCOUNTER — Encounter (HOSPITAL_COMMUNITY)
Admission: EM | Disposition: A | Discharge status: Home / Self Care | Payer: Self-pay | Source: Home / Self Care | Attending: Neurosurgery

## 2017-02-16 ENCOUNTER — Inpatient Hospital Stay (HOSPITAL_COMMUNITY): Payer: PRIVATE HEALTH INSURANCE | Admitting: Anesthesiology

## 2017-02-16 HISTORY — PX: ANTERIOR CERVICAL CORPECTOMY: SHX1159

## 2017-02-16 LAB — CBC WITH DIFFERENTIAL/PLATELET
Basophils Absolute: 0 10*3/uL (ref 0.0–0.1)
Basophils Relative: 0 %
Eosinophils Absolute: 0.1 10*3/uL (ref 0.0–0.7)
Eosinophils Relative: 1 %
HCT: 34.4 % — ABNORMAL LOW (ref 36.0–46.0)
Hemoglobin: 11.2 g/dL — ABNORMAL LOW (ref 12.0–15.0)
Lymphocytes Relative: 14 %
Lymphs Abs: 1.4 10*3/uL (ref 0.7–4.0)
MCH: 28.8 pg (ref 26.0–34.0)
MCHC: 32.6 g/dL (ref 30.0–36.0)
MCV: 88.4 fL (ref 78.0–100.0)
Monocytes Absolute: 0.7 10*3/uL (ref 0.1–1.0)
Monocytes Relative: 7 %
Neutro Abs: 7.9 10*3/uL — ABNORMAL HIGH (ref 1.7–7.7)
Neutrophils Relative %: 78 %
Platelets: 268 10*3/uL (ref 150–400)
RBC: 3.89 MIL/uL (ref 3.87–5.11)
RDW: 14.1 % (ref 11.5–15.5)
WBC: 10.2 10*3/uL (ref 4.0–10.5)

## 2017-02-16 LAB — BASIC METABOLIC PANEL
Anion gap: 6 (ref 5–15)
BUN: 15 mg/dL (ref 6–20)
CO2: 29 mmol/L (ref 22–32)
Calcium: 8.6 mg/dL — ABNORMAL LOW (ref 8.9–10.3)
Chloride: 104 mmol/L (ref 101–111)
Creatinine, Ser: 0.65 mg/dL (ref 0.44–1.00)
GFR calc Af Amer: >60 mL/min (ref >60)
GFR calc non Af Amer: >60 mL/min (ref >60)
Glucose, Bld: 122 mg/dL — ABNORMAL HIGH (ref 65–99)
Potassium: 4.2 mmol/L (ref 3.5–5.1)
Sodium: 139 mmol/L (ref 135–145)

## 2017-02-16 LAB — ABO/RH: ABO/RH(D): O POS

## 2017-02-16 LAB — TYPE AND SCREEN
ABO/RH(D): O POS
Antibody Screen: NEGATIVE

## 2017-02-16 LAB — HIV ANTIBODY (ROUTINE TESTING W REFLEX): HIV Screen 4th Generation wRfx: NONREACTIVE

## 2017-02-16 SURGERY — ANTERIOR CERVICAL CORPECTOMY
Anesthesia: General | Site: Neck

## 2017-02-16 MED ORDER — FENTANYL CITRATE (PF) 250 MCG/5ML IJ SOLN
INTRAMUSCULAR | Status: AC
  Filled 2017-02-16: qty 5

## 2017-02-16 MED ORDER — MIDAZOLAM HCL 2 MG/2ML IJ SOLN: 0.5000 mg | Freq: Once | INTRAMUSCULAR | Status: DC | PRN

## 2017-02-16 MED ORDER — SUGAMMADEX SODIUM 200 MG/2ML IV SOLN
INTRAVENOUS | Status: DC | PRN
  Administered 2017-02-16: 150 mg via INTRAVENOUS

## 2017-02-16 MED ORDER — MEPERIDINE HCL 25 MG/ML IJ SOLN: 6.2500 mg | INTRAMUSCULAR | Status: DC | PRN

## 2017-02-16 MED ORDER — OXYCODONE-ACETAMINOPHEN 5-325 MG PO TABS
1.0000 | ORAL_TABLET | ORAL | Status: DC | PRN
  Administered 2017-02-16: 1 via ORAL
  Filled 2017-02-16: qty 1

## 2017-02-16 MED ORDER — ACETAMINOPHEN 10 MG/ML IV SOLN
INTRAVENOUS | Status: DC | PRN
  Administered 2017-02-16: 1000 mg via INTRAVENOUS

## 2017-02-16 MED ORDER — LACTATED RINGERS IV SOLN
INTRAVENOUS | Status: DC | PRN
  Administered 2017-02-16 (×2): via INTRAVENOUS

## 2017-02-16 MED ORDER — DEXAMETHASONE SODIUM PHOSPHATE 10 MG/ML IJ SOLN
INTRAMUSCULAR | Status: DC | PRN
  Administered 2017-02-16: 10 mg via INTRAVENOUS

## 2017-02-16 MED ORDER — MIDAZOLAM HCL 2 MG/2ML IJ SOLN
INTRAMUSCULAR | Status: AC
  Filled 2017-02-16: qty 2

## 2017-02-16 MED ORDER — LIDOCAINE HCL (CARDIAC) 20 MG/ML IV SOLN
INTRAVENOUS | Status: DC | PRN
  Administered 2017-02-16: 20 mg via INTRAVENOUS

## 2017-02-16 MED ORDER — LIDOCAINE-EPINEPHRINE 1 %-1:100000 IJ SOLN
INTRAMUSCULAR | Status: DC | PRN
  Administered 2017-02-16: 5 mL via INTRADERMAL

## 2017-02-16 MED ORDER — ARTIFICIAL TEARS OPHTHALMIC OINT
TOPICAL_OINTMENT | OPHTHALMIC | Status: AC
  Filled 2017-02-16: qty 3.5

## 2017-02-16 MED ORDER — MIDAZOLAM HCL 5 MG/5ML IJ SOLN
INTRAMUSCULAR | Status: DC | PRN
  Administered 2017-02-16: 1 mg via INTRAVENOUS

## 2017-02-16 MED ORDER — THROMBIN 20000 UNITS EX SOLR
CUTANEOUS | Status: DC | PRN
  Administered 2017-02-16: 20 mL via TOPICAL

## 2017-02-16 MED ORDER — CEFAZOLIN SODIUM-DEXTROSE 2-3 GM-% IV SOLR
INTRAVENOUS | Status: DC | PRN
  Administered 2017-02-16: 2 g via INTRAVENOUS

## 2017-02-16 MED ORDER — BUPIVACAINE HCL (PF) 0.5 % IJ SOLN
INTRAMUSCULAR | Status: AC
  Filled 2017-02-16: qty 30

## 2017-02-16 MED ORDER — ROCURONIUM BROMIDE 100 MG/10ML IV SOLN
INTRAVENOUS | Status: DC | PRN
  Administered 2017-02-16 (×2): 10 mg via INTRAVENOUS
  Administered 2017-02-16: 20 mg via INTRAVENOUS
  Administered 2017-02-16: 50 mg via INTRAVENOUS
  Administered 2017-02-16: 10 mg via INTRAVENOUS

## 2017-02-16 MED ORDER — FENTANYL CITRATE (PF) 100 MCG/2ML IJ SOLN
INTRAMUSCULAR | Status: DC | PRN
  Administered 2017-02-16: 50 ug via INTRAVENOUS
  Administered 2017-02-16: 150 ug via INTRAVENOUS
  Administered 2017-02-16 (×4): 25 ug via INTRAVENOUS

## 2017-02-16 MED ORDER — EPHEDRINE SULFATE 50 MG/ML IJ SOLN
INTRAMUSCULAR | Status: DC | PRN
  Administered 2017-02-16 (×2): 10 mg via INTRAVENOUS

## 2017-02-16 MED ORDER — BUPIVACAINE HCL (PF) 0.5 % IJ SOLN
INTRAMUSCULAR | Status: DC | PRN
  Administered 2017-02-16: 5 mL

## 2017-02-16 MED ORDER — DEXAMETHASONE SODIUM PHOSPHATE 10 MG/ML IJ SOLN
INTRAMUSCULAR | Status: AC
  Filled 2017-02-16: qty 1

## 2017-02-16 MED ORDER — ONDANSETRON HCL 4 MG/2ML IJ SOLN
INTRAMUSCULAR | Status: AC
  Filled 2017-02-16: qty 2

## 2017-02-16 MED ORDER — ROCURONIUM BROMIDE 10 MG/ML (PF) SYRINGE
PREFILLED_SYRINGE | INTRAVENOUS | Status: AC
  Filled 2017-02-16: qty 5

## 2017-02-16 MED ORDER — HYDROMORPHONE HCL 1 MG/ML IJ SOLN: 0.2500 mg | INTRAMUSCULAR | Status: DC | PRN

## 2017-02-16 MED ORDER — ARTIFICIAL TEARS OPHTHALMIC OINT
TOPICAL_OINTMENT | OPHTHALMIC | Status: DC | PRN
  Administered 2017-02-16: 1 via OPHTHALMIC

## 2017-02-16 MED ORDER — THROMBIN 20000 UNITS EX SOLR
CUTANEOUS | Status: AC
  Filled 2017-02-16: qty 20000

## 2017-02-16 MED ORDER — ONDANSETRON HCL 4 MG/2ML IJ SOLN
INTRAMUSCULAR | Status: DC | PRN
  Administered 2017-02-16: 4 mg via INTRAVENOUS

## 2017-02-16 MED ORDER — GELATIN ABSORBABLE MT POWD
OROMUCOSAL | Status: DC | PRN
  Administered 2017-02-16: 5 mL via TOPICAL

## 2017-02-16 MED ORDER — PHENYLEPHRINE HCL 10 MG/ML IJ SOLN
INTRAMUSCULAR | Status: DC | PRN
  Administered 2017-02-16 (×4): 40 ug via INTRAVENOUS

## 2017-02-16 MED ORDER — PHENYLEPHRINE 40 MCG/ML (10ML) SYRINGE FOR IV PUSH (FOR BLOOD PRESSURE SUPPORT)
PREFILLED_SYRINGE | INTRAVENOUS | Status: AC
  Filled 2017-02-16: qty 10

## 2017-02-16 MED ORDER — CEFAZOLIN SODIUM-DEXTROSE 2-4 GM/100ML-% IV SOLN
INTRAVENOUS | Status: AC
  Filled 2017-02-16: qty 100

## 2017-02-16 MED ORDER — LIDOCAINE 2% (20 MG/ML) 5 ML SYRINGE
INTRAMUSCULAR | Status: AC
  Filled 2017-02-16: qty 5

## 2017-02-16 MED ORDER — ACETAMINOPHEN 10 MG/ML IV SOLN
INTRAVENOUS | Status: AC
  Filled 2017-02-16: qty 100

## 2017-02-16 MED ORDER — HYDROCODONE-ACETAMINOPHEN 5-325 MG PO TABS
1.0000 | ORAL_TABLET | ORAL | Status: DC | PRN
  Administered 2017-02-16: 2 via ORAL
  Administered 2017-02-17: 1 via ORAL
  Administered 2017-02-17: 2 via ORAL
  Administered 2017-02-17: 1 via ORAL
  Administered 2017-02-17 – 2017-02-18 (×3): 2 via ORAL
  Administered 2017-02-18 (×2): 1 via ORAL
  Administered 2017-02-18: 2 via ORAL
  Administered 2017-02-19: 1 via ORAL
  Filled 2017-02-16: qty 1
  Filled 2017-02-16: qty 2
  Filled 2017-02-16: qty 1
  Filled 2017-02-16 (×3): qty 2
  Filled 2017-02-16 (×3): qty 1
  Filled 2017-02-16 (×2): qty 2

## 2017-02-16 MED ORDER — LIDOCAINE-EPINEPHRINE 1 %-1:100000 IJ SOLN
INTRAMUSCULAR | Status: AC
  Filled 2017-02-16: qty 1

## 2017-02-16 MED ORDER — SODIUM CHLORIDE 0.9 % IR SOLN
Status: DC | PRN
  Administered 2017-02-16 (×2): 500 mL

## 2017-02-16 MED ORDER — EPHEDRINE 5 MG/ML INJ
INTRAVENOUS | Status: AC
  Filled 2017-02-16: qty 10

## 2017-02-16 MED ORDER — PROPOFOL 10 MG/ML IV BOLUS
INTRAVENOUS | Status: DC | PRN
  Administered 2017-02-16: 20 mg via INTRAVENOUS
  Administered 2017-02-16: 120 mg via INTRAVENOUS

## 2017-02-16 MED ORDER — THROMBIN 5000 UNITS EX SOLR
CUTANEOUS | Status: AC
  Filled 2017-02-16: qty 5000

## 2017-02-16 MED ORDER — PROMETHAZINE HCL 25 MG/ML IJ SOLN: 6.2500 mg | INTRAMUSCULAR | Status: DC | PRN

## 2017-02-16 MED ORDER — 0.9 % SODIUM CHLORIDE (POUR BTL) OPTIME
TOPICAL | Status: DC | PRN
  Administered 2017-02-16: 1000 mL

## 2017-02-16 MED ORDER — SUGAMMADEX SODIUM 200 MG/2ML IV SOLN
INTRAVENOUS | Status: AC
  Filled 2017-02-16: qty 2

## 2017-02-16 SURGICAL SUPPLY — 69 items
ADH SKN CLS APL DERMABOND .7 (GAUZE/BANDAGES/DRESSINGS) ×2
BAG DECANTER FOR FLEXI CONT (MISCELLANEOUS) ×3 IMPLANT
BIT DRILL 12X2.5XAVTR (BIT) ×1 IMPLANT
BIT DRILL AVIATOR 12 (BIT) ×3
BIT DRILL NEURO 2X3.1 SFT TUCH (MISCELLANEOUS) ×2 IMPLANT
BIT DRL 12X2.5XAVTR (BIT) ×2
BLADE ULTRA TIP 2M (BLADE) ×3 IMPLANT
BUR ACRON 5.0MM COATED (BURR) ×2 IMPLANT
CANISTER SUCT 3000ML PPV (MISCELLANEOUS) ×3 IMPLANT
CARTRIDGE OIL MAESTRO DRILL (MISCELLANEOUS) ×2 IMPLANT
COVER MAYO STAND STRL (DRAPES) ×3 IMPLANT
DECANTER SPIKE VIAL GLASS SM (MISCELLANEOUS) ×3 IMPLANT
DERMABOND ADVANCED (GAUZE/BANDAGES/DRESSINGS) ×1
DERMABOND ADVANCED .7 DNX12 (GAUZE/BANDAGES/DRESSINGS) ×2 IMPLANT
DIFFUSER DRILL AIR PNEUMATIC (MISCELLANEOUS) ×3 IMPLANT
DRAPE HALF SHEET 40X57 (DRAPES) IMPLANT
DRAPE LAPAROTOMY (DRAPES) IMPLANT
DRAPE LAPAROTOMY 100X72 PEDS (DRAPES) ×3 IMPLANT
DRAPE MICROSCOPE LEICA (MISCELLANEOUS) ×3 IMPLANT
DRAPE POUCH INSTRU U-SHP 10X18 (DRAPES) ×3 IMPLANT
DRILL NEURO 2X3.1 SOFT TOUCH (MISCELLANEOUS) ×3
ELECT COATED BLADE 2.86 ST (ELECTRODE) ×3 IMPLANT
ELECT REM PT RETURN 9FT ADLT (ELECTROSURGICAL) ×3
ELECTRODE REM PT RTRN 9FT ADLT (ELECTROSURGICAL) ×2 IMPLANT
GLOVE BIO SURGEON STRL SZ 6.5 (GLOVE) ×4 IMPLANT
GLOVE BIO SURGEON STRL SZ7 (GLOVE) ×6 IMPLANT
GLOVE BIOGEL PI IND STRL 7.5 (GLOVE) ×2 IMPLANT
GLOVE BIOGEL PI IND STRL 8 (GLOVE) ×2 IMPLANT
GLOVE BIOGEL PI INDICATOR 7.5 (GLOVE) ×2
GLOVE BIOGEL PI INDICATOR 8 (GLOVE) ×1
GLOVE ECLIPSE 7.0 STRL STRAW (GLOVE) ×4 IMPLANT
GLOVE ECLIPSE 7.5 STRL STRAW (GLOVE) ×3 IMPLANT
GLOVE EXAM NITRILE LRG STRL (GLOVE) IMPLANT
GLOVE EXAM NITRILE XL STR (GLOVE) IMPLANT
GLOVE EXAM NITRILE XS STR PU (GLOVE) IMPLANT
GOWN STRL REUS W/ TWL LRG LVL3 (GOWN DISPOSABLE) ×2 IMPLANT
GOWN STRL REUS W/ TWL XL LVL3 (GOWN DISPOSABLE) IMPLANT
GOWN STRL REUS W/TWL 2XL LVL3 (GOWN DISPOSABLE) IMPLANT
GOWN STRL REUS W/TWL LRG LVL3 (GOWN DISPOSABLE) ×6
GOWN STRL REUS W/TWL XL LVL3 (GOWN DISPOSABLE)
HALTER HD/CHIN CERV TRACTION D (MISCELLANEOUS) ×3 IMPLANT
HEMOSTAT POWDER KIT SURGIFOAM (HEMOSTASIS) ×3 IMPLANT
KIT BASIN OR (CUSTOM PROCEDURE TRAY) ×3 IMPLANT
KIT ROOM TURNOVER OR (KITS) ×3 IMPLANT
MARKER SKIN DUAL TIP RULER LAB (MISCELLANEOUS) ×2 IMPLANT
NDL HYPO 25X1 1.5 SAFETY (NEEDLE) ×1 IMPLANT
NDL SPNL 22GX3.5 QUINCKE BK (NEEDLE) ×1 IMPLANT
NEEDLE HYPO 25X1 1.5 SAFETY (NEEDLE) ×3 IMPLANT
NEEDLE SPNL 22GX3.5 QUINCKE BK (NEEDLE) ×3 IMPLANT
NS IRRIG 1000ML POUR BTL (IV SOLUTION) ×3 IMPLANT
OIL CARTRIDGE MAESTRO DRILL (MISCELLANEOUS) ×3
PACK LAMINECTOMY NEURO (CUSTOM PROCEDURE TRAY) ×3 IMPLANT
PAD ARMBOARD 7.5X6 YLW CONV (MISCELLANEOUS) ×9 IMPLANT
PATTIES SURGICAL .5 X.5 (GAUZE/BANDAGES/DRESSINGS) ×2 IMPLANT
PATTIES SURGICAL 1X1 (DISPOSABLE) ×2 IMPLANT
PLATE AVIATOR ASSY 2LVL SZ 32 (Plate) ×2 IMPLANT
RUBBERBAND STERILE (MISCELLANEOUS) ×6 IMPLANT
SCREW AVIATOR VAR SELFTAP 4X12 (Screw) ×8 IMPLANT
SPONGE INTESTINAL PEANUT (DISPOSABLE) ×3 IMPLANT
SPONGE SURGIFOAM ABS GEL 100 (HEMOSTASIS) ×3 IMPLANT
STAPLER SKIN PROX WIDE 3.9 (STAPLE) IMPLANT
SUT VIC AB 0 CT1 18XCR BRD8 (SUTURE) IMPLANT
SUT VIC AB 0 CT1 8-18 (SUTURE)
SUT VIC AB 2-0 CP2 18 (SUTURE) ×3 IMPLANT
SUT VIC AB 3-0 SH 8-18 (SUTURE) ×3 IMPLANT
TISSUE ILIAC TRICORT 2.2X45 (Bone Implant) ×2 IMPLANT
TOWEL GREEN STERILE (TOWEL DISPOSABLE) ×3 IMPLANT
TOWEL GREEN STERILE FF (TOWEL DISPOSABLE) IMPLANT
WATER STERILE IRR 1000ML POUR (IV SOLUTION) ×3 IMPLANT

## 2017-02-16 NOTE — Anesthesia Preprocedure Evaluation (Addendum)
Anesthesia Evaluation  Patient identified by MRN, date of birth, ID band Patient awake    Reviewed: Allergy & Precautions, NPO status , Patient's Chart, lab work & pertinent test results  History of Anesthesia Complications Negative for: history of anesthetic complications  Airway Mallampati: II  TM Distance: >3 FB Neck ROM: Limited  Mouth opening: Limited Mouth Opening Comment: C-Collar Dental  (+) Teeth Intact, Dental Advisory Given   Pulmonary neg pulmonary ROS,    breath sounds clear to auscultation       Cardiovascular negative cardio ROS   Rhythm:Regular Rate:Normal     Neuro/Psych Fell from ladder yesterday: acute C7 burst fracture with mild retropulsion of the posterior superior aspect of C7  C-spine not cleared    GI/Hepatic Neg liver ROS, GERD  Controlled,  Endo/Other  negative endocrine ROS  Renal/GU negative Renal ROS     Musculoskeletal  (+) Fibromyalgia -  Abdominal   Peds  Hematology Hb 11.2   Anesthesia Other Findings   Reproductive/Obstetrics                          Anesthesia Physical Anesthesia Plan  ASA: II  Anesthesia Plan: General   Post-op Pain Management:    Induction: Intravenous  Airway Management Planned: Oral ETT and Video Laryngoscope Planned  Additional Equipment:   Intra-op Plan:   Post-operative Plan: Extubation in OR  Informed Consent: I have reviewed the patients History and Physical, chart, labs and discussed the procedure including the risks, benefits and alternatives for the proposed anesthesia with the patient or authorized representative who has indicated his/her understanding and acceptance.   Dental advisory given  Plan Discussed with: CRNA and Surgeon  Anesthesia Plan Comments: (Plan routine monitors, GETA with VideoGlide intubation)        Anesthesia Quick Evaluation

## 2017-02-16 NOTE — Transfer of Care (Signed)
Immediate Anesthesia Transfer of Care Note  Patient: Alicia MerinoBeth D Drabik  Procedure(s) Performed: Procedure(s) with comments: Cervical seven  Anterior Cervical Corpectomy, Cervical six to Thoracic one arthrodesis (N/A) -  Cervical seven  Anterior Cervical Corpectomy, Cervical six to Thoracic one arthrodesis  Patient Location: PACU  Anesthesia Type:General  Level of Consciousness: sedated  Airway & Oxygen Therapy: Patient Spontanous Breathing and Patient connected to nasal cannula oxygen  Post-op Assessment: Report given to RN, Post -op Vital signs reviewed and stable and Patient moving all extremities X 4  Post vital signs: Reviewed and stable  Last Vitals:  Vitals:   02/16/17 0700 02/16/17 0800  BP: 102/65 104/62  Pulse: 69 65  Resp: 11 12  Temp:      Last Pain:  Vitals:   02/16/17 0700  TempSrc:   PainSc: 5       Patients Stated Pain Goal: 2 (02/16/17 0700)  Complications: No apparent anesthesia complications

## 2017-02-16 NOTE — Anesthesia Procedure Notes (Addendum)
Procedure Name: Intubation Date/Time: 02/16/2017 9:27 AM Performed by: Edmonia CaprioAUSTON, Marshia Tropea M Pre-anesthesia Checklist: Patient identified, Emergency Drugs available, Suction available, Patient being monitored and Timeout performed Patient Re-evaluated:Patient Re-evaluated prior to inductionOxygen Delivery Method: Circle system utilized Preoxygenation: Pre-oxygenation with 100% oxygen Intubation Type: IV induction Ventilation: Mask ventilation without difficulty Laryngoscope Size: Glidescope and 3 Grade View: Grade I Tube type: Oral Tube size: 7.5 mm Number of attempts: 1 Airway Equipment and Method: Stylet and Video-laryngoscopy Placement Confirmation: ETT inserted through vocal cords under direct vision,  positive ETCO2 and breath sounds checked- equal and bilateral Secured at: 22 cm Tube secured with: Tape Dental Injury: Teeth and Oropharynx as per pre-operative assessment  Difficulty Due To: Difficult Airway- due to cervical collar Comments: Cervical collar remained on for induction and intubation with Glidescope.

## 2017-02-16 NOTE — Anesthesia Postprocedure Evaluation (Signed)
Anesthesia Post Note  Patient: Marzetta MerinoBeth D Coleman  Procedure(s) Performed: Procedure(s) (LRB): Cervical seven  Anterior Cervical Corpectomy, Cervical six to Thoracic one arthrodesis (N/A)  Patient location during evaluation: PACU Anesthesia Type: General Level of consciousness: patient cooperative, sedated and oriented Pain management: pain level controlled Vital Signs Assessment: post-procedure vital signs reviewed and stable Respiratory status: spontaneous breathing, nonlabored ventilation, respiratory function stable and patient connected to nasal cannula oxygen Cardiovascular status: blood pressure returned to baseline and stable Postop Assessment: no signs of nausea or vomiting Anesthetic complications: no       Last Vitals:  Vitals:   02/16/17 1300 02/16/17 1315  BP: 111/64 121/61  Pulse: 94 97  Resp: 12 11  Temp:  36.6 C    Last Pain:  Vitals:   02/16/17 0700  TempSrc:   PainSc: 5                  Brekyn Huntoon,E. Camauri Fleece

## 2017-02-16 NOTE — Anesthesia Preprocedure Evaluation (Deleted)
Anesthesia Evaluation  Patient identified by MRN, date of birth, ID band Patient awake    Reviewed: Allergy & Precautions, NPO status , Patient's Chart, lab work & pertinent test results  Airway Mallampati: II  TM Distance: >3 FB Neck ROM: Limited  Mouth opening: Limited Mouth Opening Comment: Cervical collar in place Dental  (+) Teeth Intact, Dental Advisory Given   Pulmonary           Cardiovascular      Neuro/Psych    GI/Hepatic   Endo/Other    Renal/GU      Musculoskeletal   Abdominal   Peds  Hematology   Anesthesia Other Findings   Reproductive/Obstetrics                             Anesthesia Physical Anesthesia Plan Anesthesia Quick Evaluation

## 2017-02-16 NOTE — Op Note (Signed)
02/15/2017 - 02/16/2017  12:23 PM  PATIENT:  Marzetta MerinoBeth D Lunn  56 y.o. female  PRE-OPERATIVE DIAGNOSIS:  cervical seven burst fracture  POST-OPERATIVE DIAGNOSIS:  cervical seven burst fracture  PROCEDURE:  Procedure(s):  Cervical seven  Anterior Cervical Corpectomy, Cervical six to Thoracic one anterior cervical thoracic arthrodesis with tricortical iliac crest allograft and aviator cervical plating  SURGEON:  Surgeon(s): Shirlean KellyNudelman, Robert, MD Lisbeth RenshawNundkumar, Neelesh, MD  ASSISTANTS: Dr. Lisbeth RenshawNeelesh Nundkumar  ANESTHESIA:   general  EBL:  Total I/O In: 1200 [I.V.:1200] Out: 700 [Urine:575; Blood:125]  BLOOD ADMINISTERED:none  COUNT: Correct per nursing staff  DICTATION: Patient was brought to the operating room placed under general endotracheal anesthesia. Patient was placed in 10 pounds of halter traction. The neck was prepped with Betadine soap and solution and draped in a sterile fashion. An oblique incision was made on the left side of the neck paralleling the anterior border of the sternocleidomastoid. The line of the incision was infiltrated with local anesthetic with epinephrine prior to skin incision. Dissection was carried down thru the subcutaneous tissue and platysma, bipolar cautery was used to maintain hemostasis. Dissection was then carried down thru an avascular plane leaving the sternocleidomastoid carotid artery and jugular vein laterally and the trachea and esophagus medially. The ventral aspect of the vertebral column was identified and a localizing x-ray was taken. The C6-7 and C7-T1 levels were identified. The fracture fragments of C7 were identified. The annulus was incised at each level and the disc space entered. Discectomy was performed with micro-curettes and pituitary rongeurs. The operating microscope was draped and brought into the field provided additional magnification illumination and visualization. Discectomy was continued posteriorly thru the disc space and then the  cartilaginous endplates of the inferior surface of C6 and the superior surface of T1 were removed using micro-curettes along with the high-speed drill. We then proceeded with the C7 corpectomy. This was performed using the high-speed drill. Gelfoam and Surgifoam were used to maintain hemostasis. The corpectomy was continued posteriorly, until a thin layer of cortical bone remained. Then using the 2 mm thin foot plated Kerrison punch we removed the posterior cortex of C7, and subsequently the posterior longitudinal ligament. The posterior annulus of C6-7 and C7-T1 was removed. Good decompression of the spinal canal and thecal sac was achieved. Gelfoam and Surgifoam was used to establish hemostasis.   Once the corpectomy and decompression were completed, we proceeded with the arthrodesis. We then measured the height of the corpectomy space. A tricortical wedge of iliac crest allograft was soaked in saline, and then using the high-speed drill, it was cut to the height and depth of the corpectomy defect. It was then gently tamped into position between the inferior aspect of C6 and the superior aspect of T1. We then selected a 32 mm aviator cervical plate, which was positioned over the C6-T1 level. It was secured to C6 and T1 with a pair of 4 x 12 mm variable self-tapping screws at each level. Each screw hole was hand drilled and then the screws placed once all the screws were placed, final tightening of the screws was performed and then the locking system was secured. The wound was irrigated with bacitracin solution checked for hemostasis which was established and confirmed. An x-ray was taken which showed the plate and screws in good position from C6-T1 and the overall alignment to be good, although was difficult to fully visualize the construct due to the patient's shoulders. We then proceeded with closure. The platysma was closed  with interrupted inverted 2-0 undyed Vicryl suture, the subcutaneous and subcuticular  closed with interrupted inverted 3-0 undyed Vicryl suture. The skin edges were approximated with Dermabond. Following surgery the patient was taken out of cervical traction and placed back in a Aspen cervical collar. She is to be reversed from the anesthetic, extubated, and taken to the recovery room for further care.   PLAN OF CARE: Patient is to return to the 4 N. neurosurgical ICU after initial postoperative care in the PACU.  PATIENT DISPOSITION:  PACU - hemodynamically stable.   Delay start of Pharmacological VTE agent (>24hrs) due to surgical blood loss or risk of bleeding:  yes

## 2017-02-16 NOTE — Progress Notes (Signed)
Pt has no new complaints this am, has primarily low-back pain. Some neck/trapezius soreness. No new N/T/W.  EXAM:  BP 102/65   Pulse 69   Temp 98.2 F (36.8 C) (Oral)   Resp 11   Ht 5\' 3"  (1.6 m)   Wt 61.2 kg (134 lb 14.7 oz)   SpO2 100%   BMI 23.90 kg/m   Awake, alert, oriented  Speech fluent, appropriate  CN grossly intact  5/5 BUE/BLE  Collar in place  IMPRESSION:  56 y.o. female s/p fall with: - C7 burst fracture will need operative stabilization  - Zone 1 sacral fracture per ortho Dr. Aundria Rudogers can be managed conservatively, WBAT  PLAN: - Dr. Newell CoralNudelman to stabilize via C7 corpectomy, C6-T1 plating

## 2017-02-16 NOTE — Progress Notes (Signed)
Vitals:   02/16/17 0700 02/16/17 0800 02/16/17 1242 02/16/17 1300  BP: 102/65 104/62 (!) 132/58 111/64  Pulse: 69 65 (!) 108 94  Resp: 11 12 12 12   Temp:   98 F (36.7 C)   TempSrc:      SpO2: 100% 100% 98% 99%  Weight:      Height:        CBC  Recent Labs  02/15/17 1920 02/16/17 0200  WBC 13.2* 10.2  HGB 11.4* 11.2*  HCT 34.5* 34.4*  PLT 277 268   BMET  Recent Labs  02/15/17 1920 02/16/17 0200  NA 133* 139  K 3.7 4.2  CL 99* 104  CO2 26 29  GLUCOSE 146* 122*  BUN 13 15  CREATININE 0.65 0.65  CALCIUM 8.6* 8.6*    Evaluated several times in PACU. Steadily becoming less drowsy and following commands more briskly. Moving all 4 extremities to command. Good grips bilaterally, flexor plantar flexor are 5/5 bilaterally. Appears comfortable. Cervical incision clean and dry, without swelling, ecchymosis, or drainage.  Plan: Doing well following surgery, we'll plan to advance activities and diet. I've spoke with the patient's husband regarding surgery in her condition.  Hewitt ShortsNUDELMAN,ROBERT W, MD 02/16/2017, 1:17 PM

## 2017-02-17 MED ORDER — DEXAMETHASONE SODIUM PHOSPHATE 4 MG/ML IJ SOLN
4.0000 mg | Freq: Four times a day (QID) | INTRAMUSCULAR | Status: DC
  Administered 2017-02-18 – 2017-02-19 (×6): 4 mg via INTRAVENOUS
  Filled 2017-02-17 (×6): qty 1

## 2017-02-17 MED ORDER — DEXAMETHASONE SODIUM PHOSPHATE 10 MG/ML IJ SOLN
10.0000 mg | Freq: Once | INTRAMUSCULAR | Status: AC
  Administered 2017-02-17: 10 mg via INTRAVENOUS
  Filled 2017-02-17: qty 1

## 2017-02-17 NOTE — Evaluation (Signed)
Physical Therapy Evaluation Patient Details Name: Alicia Harper MRN: [id number] DOB: 1960/11/07 Today's Date: 02/17/2017   History of Present Illness  Patient is a 56 y/o female who presents with C7 burst fx and sacral ala fx s/p fall from a ladder. Pt now s/p C7 corpectomy and C6-T1 ACDF.  Clinical Impression  Patient presents with pain, weakness and impaired mobility s/p above. Tolerated gait training with min guard assist for balance/safety. Education re: how to adjust cervical collar, mobility expectations, log roll technique. Pt independent PTA and caring for children at home. Will have support of daughter in law and spouse at d/c. Plan to set up bedroom on first level. Will plan for stair training tomorrow as tolerated. Will follow acutely to maximize independence and mobility prior to return home.    Follow Up Recommendations No PT follow up;Supervision for mobility/OOB    Equipment Recommendations  None recommended by PT    Recommendations for Other Services OT consult     Precautions / Restrictions Precautions Precautions: Fall Required Braces or Orthoses: Cervical Brace Cervical Brace: Hard collar;At all times Restrictions Weight Bearing Restrictions: No      Mobility  Bed Mobility Overal bed mobility: Needs Assistance Bed Mobility: Rolling;Sidelying to Sit Rolling: Supervision Sidelying to sit: Supervision;HOB elevated       General bed mobility comments: HOB elevated 20 degrees; cues for log roll technique. Use of rail for support. no assist needed.  Transfers Overall transfer level: Needs assistance Equipment used: None Transfers: Sit to/from Stand Sit to Stand: Min guard         General transfer comment: Min guard for safety. Stood from [employer], from toilet x1, transferred to chair post ambulation.  Ambulation/Gait Ambulation/Gait assistance: Min guard Ambulation Distance (Feet): 150 Feet Assistive device: 1 person hand held assist Gait  Pattern/deviations: Step-through pattern;Decreased stride length Gait velocity: decreased   General Gait Details: Slow, guarded and mildly unsteady gait that improved with increased distance. VSS. BP soft.   Stairs            Wheelchair Mobility    Modified Rankin (Stroke Patients Only)       Balance Overall balance assessment: Needs assistance Sitting-balance support: Feet supported;No upper extremity supported Sitting balance-Leahy Scale: Good Sitting balance - Comments: Able to perform pericare without assist, reaching outside boS.   Standing balance support: During functional activity Standing balance-Leahy Scale: Fair                               Pertinent Vitals/Pain Pain Assessment: 0-10 Pain Score: 2  Pain Location: neck and shoulder Pain Descriptors / Indicators: Sore;Tightness Pain Intervention(s): Monitored during session;Repositioned;Premedicated before session    Home Living Family/patient expects to be discharged to:: Private residence Living Arrangements: Spouse/significant other;Children Available Help at Discharge: Family;Available 24 hours/day Type of Home: House Home Access: Stairs to enter Entrance Stairs-Rails: None Entrance Stairs-Number of Steps: 2 vs 5 Home Layout: Two level;Able to live on main level with bedroom/bathroom Home Equipment: None      Prior Function Level of Independence: Independent         Comments: 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      Hand Dominance        Extremity/Trunk Assessment   Upper Extremity Assessment Upper Extremity Assessment: Defer to OT evaluation    Lower Extremity Assessment Lower Extremity Assessment: Generalized weakness    Cervical / Trunk Assessment  Cervical / Trunk Assessment: Other exceptions Cervical / Trunk Exceptions: s/p spine surgery  Communication   Communication: No difficulties  Cognition Arousal/Alertness: Awake/alert Behavior During  Therapy: WFL for tasks assessed/performed Overall Cognitive Status: Within Functional Limits for tasks assessed                                        General Comments General comments (skin integrity, edema, etc.): Spouse, children present during session.    Exercises     Assessment/Plan    PT Assessment Patient needs continued PT services  PT Problem List Decreased mobility;Decreased strength;Pain;Decreased knowledge of precautions;Decreased balance       PT Treatment Interventions Therapeutic activities;Gait training;Therapeutic exercise;Patient/family education;Balance training;Stair training;Functional mobility training    PT Goals (Current goals can be found in the Care Plan section)  Acute Rehab PT Goals Patient Stated Goal: to return to PLOF PT Goal Formulation: With patient Time For Goal Achievement: 03/03/17 Potential to Achieve Goals: Good    Frequency Min 4X/week   Barriers to discharge Inaccessible home environment stairs to enter home    Co-evaluation               AM-PAC PT "6 Clicks" Daily Activity  Outcome Measure Difficulty turning over in bed (including adjusting bedclothes, sheets and blankets)?: None Difficulty moving from lying on back to sitting on the side of the bed? : None Difficulty sitting down on and standing up from a chair with arms (e.g., wheelchair, bedside commode, etc,.)?: None Help needed moving to and from a bed to chair (including a wheelchair)?: A Little Help needed walking in hospital room?: A Little Help needed climbing 3-5 steps with a railing? : A Lot 6 Click Score: 20    End of Session Equipment Utilized During Treatment: Gait belt;Cervical collar Activity Tolerance: Patient tolerated treatment well Patient left: in chair;with call bell/phone within reach;with family/visitor present Nurse Communication: Mobility status PT Visit Diagnosis: Pain;Muscle weakness (generalized) (M62.81);Unsteadiness on feet  (R26.81) Pain - part of body: Shoulder (neck)    Time: [id number] PT Time Calculation (min) (ACUTE ONLY): 26 min   Charges:   PT Evaluation $PT Eval Moderate Complexity: 1 Procedure PT Treatments $Gait Training: 8-22 mins   PT G Codes:        Mylo RedShauna Kalynn Declercq, PT, DPT [phone]    Blake DivineShauna A Takota Cahalan 02/17/2017, 1:36 PM

## 2017-02-17 NOTE — Progress Notes (Signed)
No issues overnight. Pt has mild neck soreness, tolerating diet. Back pain appears somewhat improved. Able to ambulate with minimal assistance.  EXAM:  BP (!) 101/46   Pulse 77   Temp 98.5 F (36.9 C)   Resp (!) 8   Ht 5\' 3"  (1.6 m)   Wt 61.2 kg (134 lb 14.7 oz)   SpO2 94%   BMI 23.90 kg/m   Awake, alert, oriented  Speech fluent, appropriate  CN grossly intact  5/5 BUE/BLE  Wound c/d/i  IMPRESSION:  56 y.o. female POD#1 c7 corpectomy for burst fracture, doing well Sacral fracture  PLAN: - Cont to mobilize as tolerated - May consider d/c home tomorrow

## 2017-02-18 ENCOUNTER — Inpatient Hospital Stay (HOSPITAL_COMMUNITY): Payer: PRIVATE HEALTH INSURANCE

## 2017-02-18 ENCOUNTER — Encounter (HOSPITAL_COMMUNITY): Payer: Self-pay | Admitting: Neurosurgery

## 2017-02-18 MED ORDER — POLYETHYLENE GLYCOL 3350 17 G PO PACK
17.0000 g | PACK | Freq: Every day | ORAL | Status: DC | PRN
  Administered 2017-02-18 – 2017-02-19 (×2): 17 g via ORAL
  Filled 2017-02-18 (×2): qty 1

## 2017-02-18 NOTE — Progress Notes (Signed)
Subjective: He shouldn't resting in bed. 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 Mild neck and pelvic discomfort, using hydrocodone as needed.  Objective: Vital signs in last 24 hours: Vitals:   02/18/17 0400 02/18/17 0500 02/18/17 0600 02/18/17 0700  BP: 108/71  114/69   Pulse: 60 72 (!) 59 (!) 57  Resp: 12 12 10 10   Temp: 97.9 F (36.6 C)     TempSrc: Oral     SpO2: 100% 99% 100% 100%  Weight:      Height:        Intake/Output from previous day: 05/28 0701 - 05/29 0700 In: 340 [P.O.:240; I.V.:100] Out: -  Intake/Output this shift: No intake/output data recorded.  Physical Exam:  Awake alert, oriented. Following commands. Moving all 4 extremities well. Incision healing nicely; no erythema, swelling, or drainage.  CBC  Recent Labs  02/15/17 1920 02/16/17 0200  WBC 13.2* 10.2  HGB 11.4* 11.2*  HCT 34.5* 34.4*  PLT 277 268   BMET  Recent Labs  02/15/17 1920 02/16/17 0200  NA 133* 139  K 3.7 4.2  CL 99* 104  CO2 26 29  GLUCOSE 146* 122*  BUN 13 15  CREATININE 0.65 0.65  CALCIUM 8.6* 8.6*    Studies/Results: Dg Cervical Spine Complete  Result Date: 02/16/2017 CLINICAL DATA:  Anterior cervical arthrodesis. EXAM: CERVICAL SPINE - COMPLETE 4+ VIEW COMPARISON:  MRI from 02/15/17. FINDINGS: Portable lateral radiograph of the cervical spine shows a 8 anterior screw and plate fixation of C6 through T1. The T1 level is poorly visualized. The hardware components appear to be in anatomic alignment. IMPRESSION: 1. Status post ACDF of C6 through T1. Electronically Signed   By: Signa Kellaylor  Stroud M.D.   On: 02/16/2017 12:27    Assessment/Plan: Doing well following C7 corpectomy and C6-T1 arthrodesis. We'll have patient shower this morning, and have AP and lateral upright standing cervical spine x-rays done.   Hewitt ShortsNUDELMAN,ROBERT W, MD 02/18/2017, 7:59 AM

## 2017-02-18 NOTE — Evaluation (Signed)
Occupational Therapy Evaluation/Discharge Patient Details Name: Alicia Harper MRN: [id number] DOB: [birth year] Today's Date: 02/18/2017    History of Present Illness Patient is a 56 y/o female who presents with C7 burst fx and sacral ala fx s/p fall from a ladder. Pt now s/p C7 corpectomy and C6-T1 ACDF.   Clinical Impression   PTA, pt was independent with ADL and functional mobility. She currently requires min guard assist for toilet and shower transfers due to decreased dynamic standing balance. She requires supervision for dressing and bathing tasks and educated pt concerning compensatory strategies including crossing leg over knee for LB ADL. Additionally educated pt on safe use of 3-in-1 over commode and as a shower seat. She reports ability to complete toilet hygiene without twisting and educated pt on use of wet-wipes to improve ability to complete safely. Lastly educated pt on brace wear schedule (including wearing brace for showering) and method to change and clean padding. All OT education complete and pt demonstrates understanding of all topics. She will have 24 hour assistance post-acute D/C from family and friends. No further acute OT needs identified and no OT follow-up recommended. OT will sign off.     Follow Up Recommendations  No OT follow up;Supervision/Assistance - 24 hour    Equipment Recommendations  3 in 1 bedside commode    Recommendations for Other Services       Precautions / Restrictions Precautions Precautions: Cervical Required Braces or Orthoses: Cervical Brace Cervical Brace: Hard collar;At all times Restrictions Weight Bearing Restrictions: No      Mobility Bed Mobility Overal bed mobility: Needs Assistance Bed Mobility: Rolling;Sidelying to Sit;Sit to Sidelying Rolling: Supervision Sidelying to sit: HOB elevated;Supervision     Sit to sidelying: Supervision General bed mobility comments: Pt able to complete log roll technique with VC's and  supervision for safety.   Transfers Overall transfer level: Needs assistance Equipment used: None Transfers: Sit to/from Stand Sit to Stand: Supervision         General transfer comment: Supervision for safety.     Balance Overall balance assessment: Needs assistance Sitting-balance support: Feet supported;No upper extremity supported Sitting balance-Leahy Scale: Good     Standing balance support: During functional activity;No upper extremity supported Standing balance-Leahy Scale: Fair Standing balance comment: Required min guard for dynamic standing. Able to stand statically at sink without UE support with supervision for grooming tasks at sink.                           ADL either performed or assessed with clinical judgement   ADL Overall ADL's : Needs assistance/impaired Eating/Feeding: Set up;Sitting   Grooming: Supervision/safety;Standing   Upper Body Bathing: Set up;Sitting Upper Body Bathing Details (indicate cue type and reason): Educated on avoiding overhead movements.  Lower Body Bathing: Supervison/ safety;Sit to/from stand   Upper Body Dressing : Set up;Sitting   Lower Body Dressing: Supervision/safety;Sit to/from stand   Toilet Transfer: Min guard;Ambulation   Toileting- Clothing Manipulation and Hygiene: Supervision/safety;Sit to/from stand   Tub/ Shower Transfer: Min guard;Ambulation;3 in 1   Functional mobility during ADLs: Min guard General ADL Comments: Pt educated on compensatory strategies for dressing, bathing, and grooming tasks to adhere to cervical precautions including use of 2 cups for oral care. Educated on brace wear schedule, to wear brace during shower (pt reports that she was given second brace for showering) and brace cleaning methods.      Vision Baseline Vision/History: Wears glasses Wears  Glasses: Reading only (for reading, but wears majority of the time) Patient Visual Report: No change from baseline Vision  Assessment?: No apparent visual deficits     Perception     Praxis      Pertinent Vitals/Pain Pain Assessment: 0-10 Pain Score: 2  Pain Location: neck and shoulder Pain Descriptors / Indicators: Sore;Tightness Pain Intervention(s): Limited activity within patient's tolerance;Monitored during session;Repositioned     Hand Dominance     Extremity/Trunk Assessment Upper Extremity Assessment Upper Extremity Assessment: Overall WFL for tasks assessed   Lower Extremity Assessment Lower Extremity Assessment: Generalized weakness   Cervical / Trunk Assessment Cervical / Trunk Assessment: Other exceptions Cervical / Trunk Exceptions: s/p spine surgery   Communication Communication Communication: No difficulties   Cognition Arousal/Alertness: Awake/alert Behavior During Therapy: WFL for tasks assessed/performed Overall Cognitive Status: Within Functional Limits for tasks assessed                                     General Comments       Exercises     Shoulder Instructions      Home Living Family/patient expects to be discharged to:: Private residence Living Arrangements: Spouse/significant other;Children Available Help at Discharge: Family;Available 24 hours/day Type of Home: House Home Access: Stairs to enter [employer] of Steps: 2 vs 5 Entrance Stairs-Rails: None Home Layout: Two level;Able to live on main level with bedroom/bathroom Alternate Level Stairs-Number of Steps: 1 flight Alternate Level Stairs-Rails: Right Bathroom Shower/Tub: Tub/shower unit;Walk-in shower   Bathroom Toilet: Standard     Home Equipment: None          Prior Functioning/Environment Level of Independence: Independent        Comments: 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         OT Problem List: Decreased activity tolerance;Impaired balance (sitting and/or standing);Decreased knowledge of use of DME or AE;Decreased knowledge of  precautions;Pain      OT Treatment/Interventions:      OT Goals(Current goals can be found in the care plan section) Acute Rehab OT Goals Patient Stated Goal: to return to PLOF OT Goal Formulation: With patient Time For Goal Achievement: 03/04/17 Potential to Achieve Goals: Good  OT Frequency:     Barriers to D/C:            Co-evaluation              AM-PAC PT "6 Clicks" Daily Activity     Outcome Measure Help from another person eating meals?: None Help from another person taking care of personal grooming?: A Little Help from another person toileting, which includes using toliet, bedpan, or urinal?: A Little Help from another person bathing (including washing, rinsing, drying)?: A Little Help from another person to put on and taking off regular upper body clothing?: A Little Help from another person to put on and taking off regular lower body clothing?: A Little 6 Click Score: 19   End of Session Equipment Utilized During Treatment: Gait belt;Cervical collar Nurse Communication: Mobility status  Activity Tolerance: Patient tolerated treatment well Patient left: in bed;with family/visitor present (sitting at EOB)  OT Visit Diagnosis: Other abnormalities of gait and mobility (R26.89);Pain Pain - Right/Left: Left Pain - part of body: Hip (neck)                Time: 9562-1308 OT Time Calculation (min): 16 min Charges:  OT  General Charges $OT Visit: 1 Procedure OT Evaluation $OT Eval Moderate Complexity: 1 Procedure G-Codes:     Doristine Sectionharity A Jennett Tarbell, MS OTR/L  Pager: [phone]   Squire Withey A Jayko Voorhees 02/18/2017, 4:33 PM

## 2017-02-18 NOTE — Progress Notes (Signed)
Physical Therapy Treatment Patient Details Name: Alicia Harper MRN: [id number] DOB: [birth year] Today's Date: 02/18/2017    History of Present Illness Patient is a 56 y/o female who presents with C7 burst fx and sacral ala fx s/p fall from a ladder. Pt now s/p C7 corpectomy and C6-T1 ACDF.    PT Comments    Patient progressing well towards PT goals. Tolerated gait training and stair training today with Min guard assist for safety. BP stable- sitting BP 128/80; sitting BP post activity 150/84. Discussed mobility expectations and progression. Will plan for higher level balance challenges next session prior to d/c. Will follow.   Follow Up Recommendations  No PT follow up;Supervision for mobility/OOB     Equipment Recommendations  None recommended by PT    Recommendations for Other Services       Precautions / Restrictions Precautions Precautions: None Required Braces or Orthoses: Cervical Brace Cervical Brace: Hard collar;At all times Restrictions Weight Bearing Restrictions: No    Mobility  Bed Mobility               General bed mobility comments: UP in chair upon PT arrival.  Transfers Overall transfer level: Needs assistance Equipment used: None Transfers: Sit to/from Stand Sit to Stand: Supervision         General transfer comment: SUpervision for safety. No dizziness.  Ambulation/Gait Ambulation/Gait assistance: Min guard Ambulation Distance (Feet): 275 Feet Assistive device: None Gait Pattern/deviations: Step-through pattern;Decreased stride length Gait velocity: decreased Gait velocity interpretation: Below normal speed for age/gender General Gait Details: Slow, guarded gait but steadier today. HR stable.    Stairs Stairs: Yes   Stair Management: One rail Right;Step to pattern Number of Stairs: 13 General stair comments: Cues for technique and safety.   Wheelchair Mobility    Modified Rankin (Stroke Patients Only)       Balance Overall  balance assessment: Needs assistance Sitting-balance support: Feet supported;No upper extremity supported Sitting balance-Leahy Scale: Good     Standing balance support: During functional activity Standing balance-Leahy Scale: Fair                              Cognition Arousal/Alertness: Awake/alert Behavior During Therapy: WFL for tasks assessed/performed Overall Cognitive Status: Within Functional Limits for tasks assessed                                        Exercises      General Comments General comments (skin integrity, edema, etc.): Spouse and family member present during session.      Pertinent Vitals/Pain Pain Assessment: 0-10 Pain Score: 2  Pain Location: neck and shoulder Pain Descriptors / Indicators: Sore;Tightness Pain Intervention(s): Monitored during session;Repositioned    Home Living                      Prior Function            PT Goals (current goals can now be found in the care plan section) Progress towards PT goals: Progressing toward goals    Frequency    Min 4X/week      PT Plan Current plan remains appropriate    Co-evaluation              AM-PAC PT "6 Clicks" Daily Activity  Outcome Measure  Difficulty turning over in bed (including  adjusting bedclothes, sheets and blankets)?: None Difficulty moving from lying on back to sitting on the side of the bed? : None Difficulty sitting down on and standing up from a chair with arms (e.g., wheelchair, bedside commode, etc,.)?: None Help needed moving to and from a bed to chair (including a wheelchair)?: None Help needed walking in hospital room?: None Help needed climbing 3-5 steps with a railing? : A Little 6 Click Score: 23    End of Session Equipment Utilized During Treatment: Gait belt;Cervical collar Activity Tolerance: Patient tolerated treatment well Patient left: in chair;with call bell/phone within reach;with family/visitor  present Nurse Communication: Mobility status PT Visit Diagnosis: Pain;Muscle weakness (generalized) (M62.81);Unsteadiness on feet (R26.81) Pain - part of body: Shoulder     Time: 1610-9604 PT Time Calculation (min) (ACUTE ONLY): 21 min  Charges:  $Gait Training: 8-22 mins                    G Codes:       Mylo Red, PT, DPT [phone]     Blake Divine A Baila Rouse 02/18/2017, 10:18 AM

## 2017-02-19 MED ORDER — HYDROCODONE-ACETAMINOPHEN 5-325 MG PO TABS: 1.0000 | ORAL_TABLET | ORAL | 0 refills | Status: DC | PRN

## 2017-02-19 MED ORDER — CYCLOBENZAPRINE HCL 5 MG PO TABS: 5.0000 mg | ORAL_TABLET | Freq: Three times a day (TID) | ORAL | Status: DC | PRN

## 2017-02-19 MED ORDER — CYCLOBENZAPRINE HCL 5 MG PO TABS: 5.0000 mg | ORAL_TABLET | Freq: Three times a day (TID) | ORAL | 0 refills | Status: DC | PRN

## 2017-02-19 NOTE — Discharge Summary (Signed)
Physician Discharge Summary  Patient ID: Alicia Harper MRN: [id number] DOB/AGE: 1960-11-19 56 y.o.  Admit date: 02/15/2017 Discharge date: 02/19/2017  Admission Diagnoses:  C7 burst fracture, cervical spondylosis, cervical degenerative disease, sacral fracture with extension across the SI joint  Discharge Diagnoses:  C7 burst fracture, cervical spondylosis, cervical degenerative disease, sacral fracture with extension across the SI joint Active Problems:   C7 cervical fracture [facility])   Discharged Condition: good  Hospital Course: Patient presented to the emergency room having been brought in by EMS. She underwent evaluation in the emergency room was found on CT of the cervical spine to have a C7 burst fracture and admission to neurosurgery was requested. Although the CT of the lumbar spine was read as just showing degenerative changes, my review was suspicious for a sacral fracture, and further radiologic consultation was obtained. Further trauma workup was obtained as well including CT scans of the wax, abdomen, and pelvis, which were reviewed by Dr. Jimmye Norman from the trauma surgical service. He went ahead and obtained orthopedic consultation Dr. Duwayne Heck from [facility] orthopedics regarding the sacral fracture. Dr. Aundria Rud felt that patient could have weightbearing and recommended follow-up with him in the office.  As regards the C7 burst fracture. The patient was taken to surgery for C7 corpectomy and a C6-T1 anterior cervical arthrodesis with tricortical iliac crest allograft and anterior cervical plating. Postoperatively she has done well. She's been immobilized in a Aspen cervical collar. She is up and ambulating in the halls. Her incision is benign healing nicely. There is no swelling, erythema, or drainage. She is voiding well. She is being discharged to home with instructions having been given to both she and her husband. We've asked to return in 3 weeks to my office for follow-up with  a lateral cervical spine x-ray. She's also to follow-up with Dr. Aundria Rud at his office with x-rays. She is to call for both of these appointments.  Consults: PT, OT  Discharge Exam: Blood pressure 127/86, pulse 78, temperature 98.5 F (36.9 C), resp. rate 16, height 5\' 3"  (1.6 m), weight 61.2 kg (134 lb 14.7 oz), SpO2 100 %.  Disposition:  Home  Discharge Instructions    Discharge wound care:    Complete by:  As directed    Leave the wound open to air. Shower daily with the wound uncovered. Water and soapy water should run over the incision area. Do not wash directly on the incision for 2 weeks. Remove the glue after 2 weeks.   Driving Restrictions    Complete by:  As directed    No driving for 2 weeks. May ride in the car locally now. May begin to drive locally in 2 weeks.   Other Restrictions    Complete by:  As directed    Walk gradually increasing distances out in the fresh air at least twice a day. Walking additional 6 times inside the house, gradually increasing distances, daily. No bending, lifting, or twisting. Perform activities between shoulder and waist height (that is at counter height when standing or table height when sitting).     Allergies as of 02/19/2017      Reactions   Tramadol Itching   Savella [milnacipran] Palpitations, Other (See Comments)   Chest pain      Medication List    TAKE these medications   cyclobenzaprine 5 MG tablet Commonly known as:  FLEXERIL Take 1-2 tablets (5-10 mg total) by mouth 3 (three) times daily as needed for muscle spasms.  escitalopram 20 MG tablet Commonly known as:  LEXAPRO Take 1 tablet (20 mg total) by mouth daily. 1/2 What changed:  how much to take  additional instructions   FISH OIL PO Take 1 capsule by mouth daily.   fluticasone 50 MCG/ACT nasal spray Commonly known as:  FLONASE Place 2 sprays into both nostrils daily as needed for allergies.   gentamicin ointment 0.1 % Commonly known as:  GARAMYCIN Apply  1 application topically 3 (three) times daily.   HYDROcodone-acetaminophen 5-325 MG tablet Commonly known as:  NORCO/VICODIN Take 0.5 tablets by mouth every 6 (six) hours as needed for moderate pain. What changed:  Another medication with the same name was added. Make sure you understand how and when to take each.   HYDROcodone-acetaminophen 5-325 MG tablet Commonly known as:  NORCO/VICODIN Take 1-2 tablets by mouth every 4 (four) hours as needed for moderate pain. What changed:  You were already taking a medication with the same name, and this prescription was added. Make sure you understand how and when to take each.   MAGNESIUM PO Take 1 capsule by mouth daily.   methylPREDNISolone 4 MG tablet Commonly known as:  MEDROL Take 4-24 mg by mouth See admin instructions. Take 24 mg by mouth on day 1, then take 20 mg by mouth on day 2, then take 16 mg by mouth on day 3, then take 12 mg by mouth on day 4, then take 8 mg by mouth on day 5, then take 4 mg by mouth on day 6.   predniSONE 50 MG tablet Commonly known as:  DELTASONE Take 1 tablet (50 mg total) by mouth daily.   Turmeric 450 MG Caps Take 450 mg by mouth 2 (two) times daily.   VITAMIN C PO Take 1 tablet by mouth daily.   Vitamin D3 1000 units Caps Take 1,000 Units by mouth daily.   zolpidem 10 MG tablet Commonly known as:  AMBIEN TAKE 1 TABLET AT BEDTIME AS NEEDED FOR SLEEP What changed:  See the new instructions.            Durable Medical Equipment        Start     Ordered   02/19/17 1044  For home use only DME 3 n 1  Once     02/19/17 1043     Follow-up Information    Shirlean KellyNudelman, Robert, MD. Call in 1 week(s).   Specialty:  Neurosurgery Why:  call to schedule an appointment for about 3 weeks from now with a lateral cervical spine xray Contact information: [address] [place] [address] [phone]        Yolonda Kidaogers, Jason Patrick, MD. Call in 1 week(s).   Specialty:  Orthopedic  Surgery Why:  call to schedule a follow up appointment of sacral fractue with xrays  Contact information: [address] [place] [address] [phone]           Signed: Hewitt ShortsUDELMAN,ROBERT W 02/19/2017, 2:24 PM

## 2017-02-19 NOTE — Progress Notes (Signed)
Physical Therapy Treatment Patient Details Name: AALIJAH MIMS MRN: [id number] DOB: [birth year] Today's Date: 02/19/2017    History of Present Illness Patient is a 56 y/o female who presents with C7 burst fx and sacral ala fx s/p fall from a ladder. Pt now s/p C7 corpectomy and C6-T1 ACDF.    PT Comments    Pt progressing well with mobility and balance. Was able to demonstrate transfers and ambulation with modified independence at this time. Pt and sister were educated on precautions, car transfer, walking program, and general safety at d/c. Pt has met PT goals, and no longer has acute PT needs. Will sign off at this time. If needs change, please reconsult.    Follow Up Recommendations  No PT follow up;Supervision for mobility/OOB     Equipment Recommendations  None recommended by PT    Recommendations for Other Services OT consult     Precautions / Restrictions Precautions Precautions: Cervical Precaution Comments: Pt was cued for precautions during functional mobility.  Required Braces or Orthoses: Cervical Brace Cervical Brace: Hard collar;At all times Restrictions Weight Bearing Restrictions: No    Mobility  Bed Mobility Overal bed mobility: Needs Assistance Bed Mobility: Rolling;Sidelying to Sit Rolling: Modified independent (Device/Increase time) Sidelying to sit: Modified independent (Device/Increase time)       General bed mobility comments: Pt was able to complete log roll without assist. VC's for minor technique changes, but overall performed well.   Transfers Overall transfer level: Modified independent Equipment used: None Transfers: Sit to/from Stand Sit to Stand: Modified independent (Device/Increase time)         General transfer comment: No assist required. No unsteadiness noted.   Ambulation/Gait Ambulation/Gait assistance: Modified independent (Device/Increase time) Ambulation Distance (Feet): 400 Feet Assistive device: None Gait  Pattern/deviations: Step-through pattern;Decreased stride length     General Gait Details: Slightly slowed gait. Pt casually walked and had hands in pockets at times. No unsteadiness noted and no assist required.    Stairs         General stair comments: Declined stair training, stating she felt comfortable with stair negotiation.   Wheelchair Mobility    Modified Rankin (Stroke Patients Only)       Balance Overall balance assessment: Needs assistance Sitting-balance support: Feet supported;No upper extremity supported Sitting balance-Leahy Scale: Good Sitting balance - Comments: Able to perform pericare without assist, reaching outside boS.   Standing balance support: During functional activity;No upper extremity supported Standing balance-Leahy Scale: Fair                              Cognition Arousal/Alertness: Awake/alert Behavior During Therapy: WFL for tasks assessed/performed Overall Cognitive Status: Within Functional Limits for tasks assessed                                        Exercises      General Comments        Pertinent Vitals/Pain Pain Assessment: Faces Faces Pain Scale: Hurts a little bit Pain Location: Reports pain is well controlled. Cervical pain reported Pain Descriptors / Indicators: Sore;Tightness Pain Intervention(s): Limited activity within patient's tolerance;Monitored during session;Repositioned    Home Living                      Prior Function  PT Goals (current goals can now be found in the care plan section) Acute Rehab PT Goals Patient Stated Goal: to return to PLOF PT Goal Formulation: With patient/family Time For Goal Achievement: 03/03/17 Potential to Achieve Goals: Good Progress towards PT goals: Goals met/education completed, patient discharged from PT    Frequency    Min 4X/week      PT Plan Current plan remains appropriate    Co-evaluation               AM-PAC PT "6 Clicks" Daily Activity  Outcome Measure  Difficulty turning over in bed (including adjusting bedclothes, sheets and blankets)?: None Difficulty moving from lying on back to sitting on the side of the bed? : None Difficulty sitting down on and standing up from a chair with arms (e.g., wheelchair, bedside commode, etc,.)?: None Help needed moving to and from a bed to chair (including a wheelchair)?: None Help needed walking in hospital room?: None Help needed climbing 3-5 steps with a railing? : A Little 6 Click Score: 23    End of Session Equipment Utilized During Treatment: Gait belt;Cervical collar Activity Tolerance: Patient tolerated treatment well Patient left: in chair;with call bell/phone within reach;with family/visitor present Nurse Communication: Mobility status PT Visit Diagnosis: Pain;Muscle weakness (generalized) (M62.81);Unsteadiness on feet (R26.81) Pain - part of body: Shoulder     Time: 1610-9604 PT Time Calculation (min) (ACUTE ONLY): 21 min  Charges:  $Gait Training: 8-22 mins                    G Codes:       Rolinda Roan, PT, DPT Acute Rehabilitation Services Pager: [phone]    Thelma Comp 02/19/2017, 8:53 AM

## 2017-08-25 IMAGING — CR DG CERVICAL SPINE 2 OR 3 VIEWS
2 series · 2 of 2 positions shown · non-contrast
Comparison: 02/16/2017.

CLINICAL DATA: Cervical spine fusion

EXAM:
CERVICAL SPINE - 2-3 VIEW

[c-spine lat]
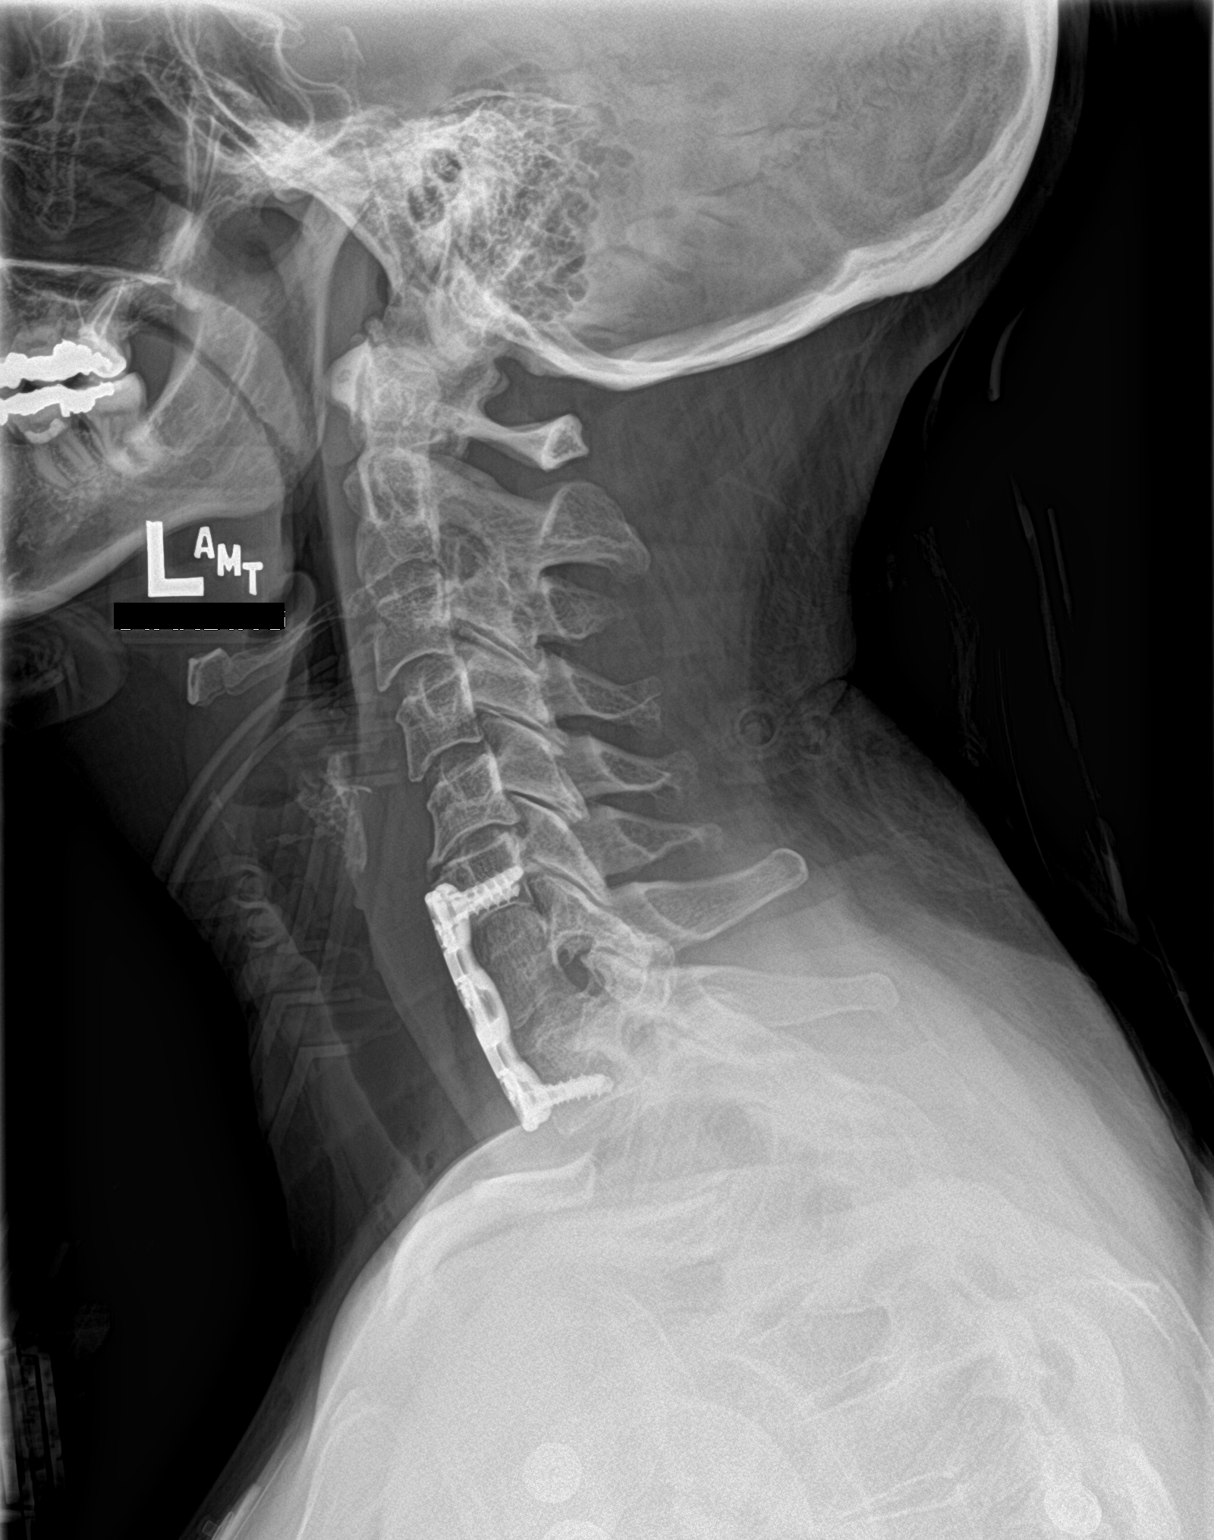

[c-spine ap]
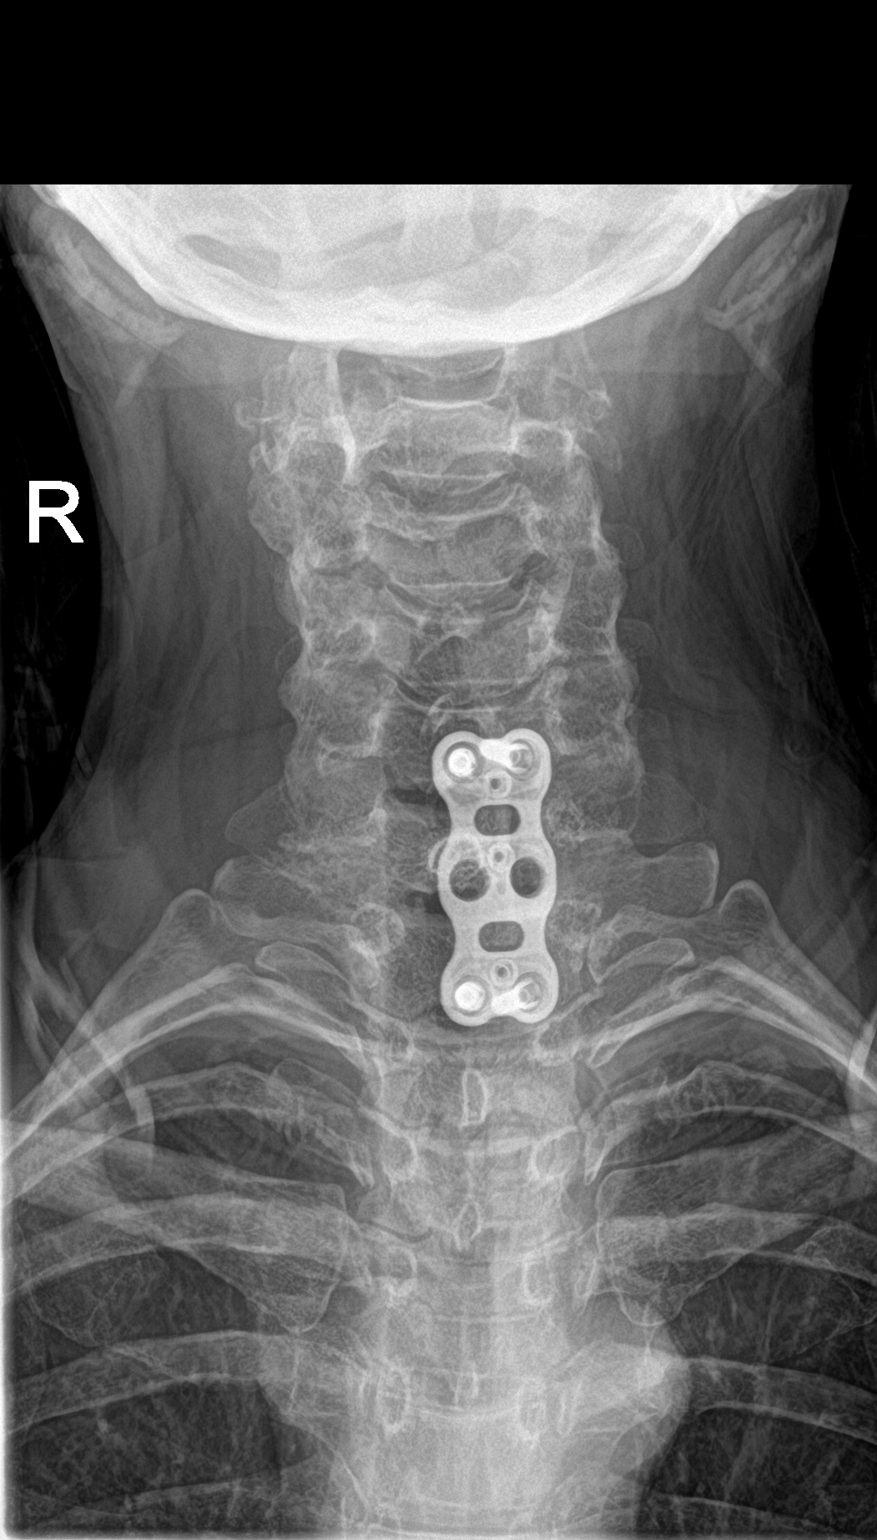

[2 of 2 positions shown; findings below may reference images not displayed]

FINDINGS: C6 through T1 anterior cervical fusion. Hardware intact. Anatomic
alignment. Diffuse degenerative change. No acute abnormality.
IMPRESSION: C6-T1 anterior cervical spine fusion with anatomic alignment.

## 2017-08-27 ENCOUNTER — Other Ambulatory Visit (HOSPITAL_COMMUNITY)
Admission: RE | Admit: 2017-08-27 | Discharge: 2017-08-27 | Disposition: A | Discharge status: Home / Self Care | Payer: PRIVATE HEALTH INSURANCE | Source: Ambulatory Visit | Attending: Internal Medicine | Admitting: Internal Medicine

## 2017-08-27 ENCOUNTER — Other Ambulatory Visit: Payer: Self-pay | Admitting: Internal Medicine

## 2017-08-27 DIAGNOSIS — Z01419 Encounter for gynecological examination (general) (routine) without abnormal findings: Secondary | ICD-10-CM | POA: Diagnosis not present

## 2017-08-29 LAB — CYTOLOGY - PAP: Diagnosis: NEGATIVE

## 2019-07-21 ENCOUNTER — Other Ambulatory Visit: Payer: Self-pay | Admitting: Neurosurgery

## 2019-07-21 DIAGNOSIS — S129XXA Fracture of neck, unspecified, initial encounter: Secondary | ICD-10-CM

## 2019-07-26 ENCOUNTER — Ambulatory Visit
Admission: RE | Admit: 2019-07-26 | Discharge: 2019-07-26 | Disposition: A | Discharge status: Home / Self Care | Payer: PRIVATE HEALTH INSURANCE | Source: Ambulatory Visit | Attending: Neurosurgery | Admitting: Neurosurgery

## 2019-07-26 ENCOUNTER — Other Ambulatory Visit: Payer: Self-pay | Admitting: Neurosurgery

## 2019-07-26 DIAGNOSIS — S129XXA Fracture of neck, unspecified, initial encounter: Secondary | ICD-10-CM

## 2019-10-05 ENCOUNTER — Other Ambulatory Visit: Payer: Self-pay | Admitting: Internal Medicine

## 2019-10-05 DIAGNOSIS — Z780 Asymptomatic menopausal state: Secondary | ICD-10-CM

## 2019-11-13 ENCOUNTER — Ambulatory Visit
Admission: EM | Admit: 2019-11-13 | Discharge: 2019-11-13 | Disposition: A | Discharge status: Home / Self Care | Payer: PRIVATE HEALTH INSURANCE

## 2019-11-13 ENCOUNTER — Other Ambulatory Visit: Payer: Self-pay

## 2019-11-13 ENCOUNTER — Encounter: Payer: Self-pay | Admitting: Emergency Medicine

## 2019-11-13 ENCOUNTER — Ambulatory Visit (INDEPENDENT_AMBULATORY_CARE_PROVIDER_SITE_OTHER): Payer: PRIVATE HEALTH INSURANCE

## 2019-11-13 DIAGNOSIS — R0781 Pleurodynia: Secondary | ICD-10-CM | POA: Diagnosis not present

## 2019-11-13 DIAGNOSIS — R0782 Intercostal pain: Secondary | ICD-10-CM | POA: Diagnosis not present

## 2019-11-13 MED ORDER — MELOXICAM 7.5 MG PO TABS: 7.5000 mg | ORAL_TABLET | Freq: Every day | ORAL | 0 refills | Status: AC

## 2019-11-13 MED ORDER — TIZANIDINE HCL 2 MG PO CAPS: 2.0000 mg | ORAL_CAPSULE | Freq: Three times a day (TID) | ORAL | 0 refills | Status: DC | PRN

## 2019-11-13 NOTE — ED Provider Notes (Signed)
[facility]    CSN: [id number] Arrival date & time: 11/13/19  1018      History   Chief Complaint Chief Complaint  Patient presents with  . Appointment    1030  . rib pain    HPI Alicia Harper is a 59 y.o. female.   59 year old female comes in for right rib/sternum pain after PT yesterday.  She is currently having PT for right neck stiffness.  States was doing manipulation of the clavicle when she felt a pop to the sternum area.  States immediately after the "pop", she had improvement of her current chronic symptoms.  However, after her session for PT, started having pain to the area, radiating to the lateral side of right rib.  She denies any other injury/trauma.  Since then, pain has been increasing with cramping/tightness sensation, worse with movement.  Denies chest pain, shortness of breath.  Denies swelling, erythema, contusion.  She has been doing ice compress, ibuprofen without relief.     Past Medical History:  Diagnosis Date  . Arthritis   . Hyperlipidemia   . Insomnia   . Neuromuscular disorder (HCC)    fibromyalgia  . Thyroid disease     Patient Active Problem List   Diagnosis Date Noted  . C7 cervical fracture (HCC) 02/15/2017  . ETD (Eustachian tube dysfunction), right 08/01/2016  . BPPV (benign paroxysmal positional vertigo), unspecified laterality 08/01/2016  . Numbness 07/25/2016  . Itching 06/04/2016  . PND (post-nasal drip) 06/04/2016  . Environmental and seasonal allergies 06/04/2016  . Vitamin D insufficiency 06/04/2016  . Prediabetes 06/04/2016  . Chronic fatigue 06/04/2016  . Hair loss 06/04/2016  . Cold intolerance 06/04/2016  . h/o Tick bites- never tested positive 05/08/2016  . h/o Fatty liver disease, nonalcoholic 05/08/2016  . Perimenopausal 11/15/2014  . Primary osteoarthritis involving multiple joints 10/28/2014  . Arthritis of carpometacarpal joint 10/04/2013  . Fibromyalgia 07/07/2013  . Primary insomnia 06/17/2013    . h/o Subclinical hypothyroidism- now N 06/17/2013  . Nonspecific abnormal results of other endocrine function study 05/13/2012  . IBS (irritable bowel syndrome) 12/25/2011  . Major depression, single episode, in complete remission (HCC) 12/25/2011    Past Surgical History:  Procedure Laterality Date  . ANTERIOR CERVICAL CORPECTOMY N/A 02/16/2017   Procedure: Cervical seven  Anterior Cervical Corpectomy, Cervical six to Thoracic one arthrodesis;  Surgeon: Shirlean Kelly, MD;  Location: [facility];  Service: Neurosurgery;  Laterality: N/A;   Cervical seven  Anterior Cervical Corpectomy, Cervical six to Thoracic one arthrodesis    OB History   No obstetric history on file.      Home Medications    Prior to Admission medications   Medication Sig Start Date End Date Taking? Authorizing Provider  predniSONE (DELTASONE) 5 MG tablet Take 5 mg by mouth daily with breakfast.   Yes [provider]  Ascorbic Acid (VITAMIN C PO) Take 1 tablet by mouth daily.    [provider]  Cholecalciferol (VITAMIN D3) 1000 units CAPS Take 1,000 Units by mouth daily.     [provider]  escitalopram (LEXAPRO) 20 MG tablet Take 1 tablet (20 mg total) by mouth daily. 1/2 Patient taking differently: Take 10 mg by mouth daily. 1/2 11/21/16   Danford, Orpha Bur D, NP  fluticasone (FLONASE) 50 MCG/ACT nasal spray Place 2 sprays into both nostrils daily as needed for allergies.     [provider]  MAGNESIUM PO Take 1 capsule by mouth daily.    [provider]  meloxicam (MOBIC) 7.5 MG tablet Take 1 tablet (7.5 mg total) by mouth daily. 11/13/19   Tasia Catchings, Joan Avetisyan V, PA-C  Omega-3 Fatty Acids (FISH OIL PO) Take 1 capsule by mouth daily.    [provider]  tizanidine (ZANAFLEX) 2 MG capsule Take 1 capsule (2 mg total) by mouth 3 (three) times daily as needed for muscle spasms. 11/13/19   Ok Edwards, PA-C  Turmeric 450 MG CAPS Take 450 mg by mouth 2 (two) times daily.     [provider]  zolpidem (AMBIEN) 10 MG tablet TAKE 1 TABLET AT BEDTIME AS NEEDED FOR SLEEP Patient taking differently: Take 5-10 mg by mouth at bedtime as needed for sleep 10/04/16   Mellody Dance, DO    Family History Family History  Problem Relation Age of Onset  . Cancer Father        kidney  . Hypertension Father   . Hypothyroidism Father   . Hypertension Maternal Grandmother   . Stroke Maternal Grandmother   . Stroke Maternal Grandfather   . Stroke Paternal Grandmother   . Heart attack Paternal Grandfather   . Hypothyroidism Mother   . Hypothyroidism Sister     Social History Social History   Tobacco Use  . Smoking status: Never Smoker  . Smokeless tobacco: Never Used  Substance Use Topics  . Alcohol use: No  . Drug use: No     Allergies   Tramadol and Savella [milnacipran]   Review of Systems Review of Systems  Reason unable to perform ROS: See HPI as above.     Physical Exam Triage Vital Signs ED Triage Vitals  Enc Vitals Group     BP 11/13/19 1029 (!) 166/90     Pulse Rate 11/13/19 1029 87     Resp 11/13/19 1029 18     Temp 11/13/19 1029 98.2 F (36.8 C)     Temp Source 11/13/19 1029 Oral     SpO2 11/13/19 1029 97 %     Weight --      Height --      Head Circumference --      Peak Flow --      Pain Score 11/13/19 1030 8     Pain Loc --      Pain Edu? --      Excl. in [place]? --    No data found.  Updated Vital Signs BP (!) 166/90 (BP Location: Right Arm)   Pulse 87   Temp 98.2 F (36.8 C) (Oral)   Resp 18   SpO2 97%   Physical Exam Constitutional:      General: She is not in acute distress.    Appearance: Normal appearance. She is well-developed. She is not toxic-appearing or diaphoretic.  HENT:     Head: Normocephalic and atraumatic.  Eyes:     Conjunctiva/sclera: Conjunctivae normal.     Pupils: Pupils are equal, round, and reactive to light.  Cardiovascular:     Rate and Rhythm: Normal rate and regular rhythm.  Pulmonary:       Effort: Pulmonary effort is normal. No respiratory distress.     Comments: Speaking in full sentences without difficulty. LTCAB Chest:     Comments: No swelling, contusion.  Slight erythema to where ice pack was, which is cool to touch.  No tenderness to palpation along the clavicle.  No tenderness to palpation along sternum.  Diffuse tenderness to palpation of right chest, lateral ribs.  Full range of  motion of back and shoulder. Musculoskeletal:     Cervical back: Normal range of motion and neck supple.  Skin:    General: Skin is warm and dry.  Neurological:     Mental Status: She is alert and oriented to person, place, and time.      UC Treatments / Results  Labs (all labs ordered are listed, but only abnormal results are displayed) Labs Reviewed - No data to display  EKG   Radiology DG Ribs Unilateral W/Chest Right  Result Date: 11/13/2019 CLINICAL DATA:  Rib pain after popping sensation during physical therapy yesterday. EXAM: RIGHT RIBS AND CHEST - 3+ VIEW COMPARISON:  02/15/2017 FINDINGS: Lungs are adequately inflated and otherwise clear. Cardiomediastinal silhouette is normal. Fusion hardware over the cervicothoracic junction intact. No evidence of acute fracture. Mild degenerative change of the spine. Surgical clips over the right upper quadrant. IMPRESSION: No acute findings. Electronically Signed   By: Elberta Fortis M.D.   On: 11/13/2019 11:49    Procedures Procedures (including critical care time)  Medications Ordered in UC Medications - No data to display  Initial Impression / Assessment and Plan / UC Course  I have reviewed the triage vital signs and the nursing notes.  Pertinent labs & imaging results that were available during my care of the patient were reviewed by me and considered in my medical decision making (see chart for details).    Patient requesting x-ray, as on chronic prednisone, and has had minimal injury with fractured ribs in the past.  She  is scheduled for bone density scan due to this.  Will obtain rib x-ray for further evaluation.  X-ray negative.  Lungs clear to auscultation bilaterally without adventitious lung sounds.  Patient on chronic low-dose prednisone, will avoid changing dosage at this time.  Will start with Mobic first, and add tizanidine.  If symptoms not improving, may switch to prednisone 50mg  for 5 days.  Otherwise patient to follow-up with PCP/orthopedics for further evaluation.  Return precautions given.  Patient expresses understanding and agrees to plan.   Final Clinical Impressions(s) / UC Diagnoses   Final diagnoses:  Rib pain on right side   ED Prescriptions    Medication Sig Dispense Auth. Provider   meloxicam (MOBIC) 7.5 MG tablet Take 1 tablet (7.5 mg total) by mouth daily. 10 tablet Toneisha Savary V, PA-C   tizanidine (ZANAFLEX) 2 MG capsule Take 1 capsule (2 mg total) by mouth 3 (three) times daily as needed for muscle spasms. 15 capsule [birth year]1974, PA-C     PDMP not reviewed this encounter.   Belinda Fisher, PA-C 11/13/19 1254

## 2019-11-13 NOTE — Discharge Instructions (Signed)
Xray negative. Start Mobic. Do not take ibuprofen (motrin/advil)/ naproxen (aleve) while on mobic. Tizanidine as needed, this can make you drowsy, so do not take if you are going to drive, operate heavy machinery, or make important decisions. Ice/heat compresses as needed. This can take up to 3-4 weeks to completely resolve, but you should be feeling better each week. Follow up with PCP/orthopedics if symptoms worsen, changes for reevaluation. If sudden shortness of breath, chest pain, trouble breathing, go to the emergency department for further evaluation.

## 2019-11-13 NOTE — ED Triage Notes (Signed)
Pt here for right sided rib and sternum pain after having PT yesterday with manipulation and "popping" sound to that area that is now increasingly painful

## 2019-11-27 ENCOUNTER — Ambulatory Visit: Payer: PRIVATE HEALTH INSURANCE | Attending: Internal Medicine

## 2019-11-27 DIAGNOSIS — Z23 Encounter for immunization: Secondary | ICD-10-CM | POA: Insufficient documentation

## 2019-11-27 NOTE — Progress Notes (Signed)
   Covid-19 Vaccination Clinic  Name:  MONQUE HAGGAR    MRN: [id number] DOB: [birth year]  11/27/2019  Ms. Reitman was observed post Covid-19 immunization for 15 minutes without incident. She was provided with Vaccine Information Sheet and instruction to access the V-Safe system.   Ms. Dotter was instructed to call 911 with any severe reactions post vaccine: Marland Kitchen Difficulty breathing  . Swelling of face and throat  . A fast heartbeat  . A bad rash all over body  . Dizziness and weakness   Immunizations Administered    Name Date Dose VIS Date Route   Pfizer COVID-19 Vaccine 11/27/2019  5:52 PM 0.3 mL 09/03/2019 Intramuscular   Manufacturer: [employer], [employer]   Lot: [id number]   NDC: [address]-4

## 2019-12-21 ENCOUNTER — Other Ambulatory Visit: Payer: PRIVATE HEALTH INSURANCE

## 2019-12-28 ENCOUNTER — Ambulatory Visit: Payer: PRIVATE HEALTH INSURANCE | Attending: Internal Medicine

## 2019-12-28 DIAGNOSIS — Z23 Encounter for immunization: Secondary | ICD-10-CM

## 2019-12-28 NOTE — Progress Notes (Signed)
   Covid-19 Vaccination Clinic  Name:  Alicia Harper    MRN: [id number] DOB: [birth year]  12/28/2019  Alicia Harper was observed post Covid-19 immunization for 15 minutes without incident. She was provided with Vaccine Information Sheet and instruction to access the V-Safe system.   Alicia Harper was instructed to call 911 with any severe reactions post vaccine: Marland Kitchen Difficulty breathing  . Swelling of face and throat  . A fast heartbeat  . A bad rash all over body  . Dizziness and weakness   Immunizations Administered    Name Date Dose VIS Date Route   Pfizer COVID-19 Vaccine 12/28/2019  3:34 PM 0.3 mL 09/03/2019 Intramuscular   Manufacturer: [employer], [employer]   Lot: [id number]   NDC: [address]-8

## 2020-01-30 IMAGING — CT CT CERVICAL SPINE W/O CM
3 of 5 series · 11 of 33 positions shown, 13 images · non-contrast
Comparison: MRI cervical spine 07/21/2019.

CLINICAL DATA: Patient status post C7 corpectomy and C6-T1 anterior
fusion 02/16/2017 after the patient suffered a C7 burst fracture in
a fall from a ladder 02/15/2017. Neck pain. Evaluate healing.

EXAM:
CT CERVICAL SPINE WITHOUT CONTRAST
TECHNIQUE: Multidetector CT imaging of the cervical spine was performed without
intravenous contrast. Multiplanar CT image reconstructions were also
generated.

[Series 3: c-spine 2.00 br60 s3 axial bone · axial · 0.23mm/px · z∈[-713,-621]mm · 3 of 93 slices shown, 4 images]
[im 24/93  soft-tissue]
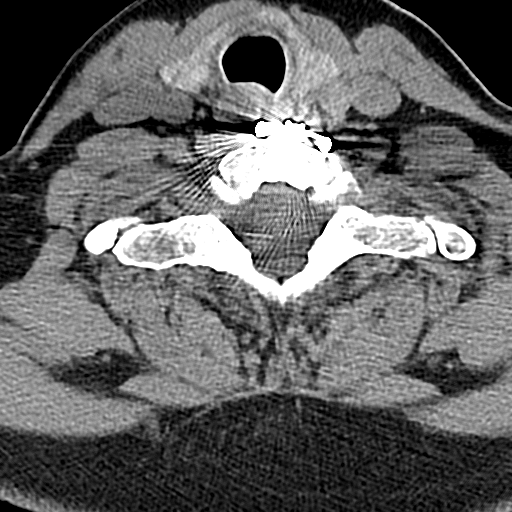
[im 24/93  bone]
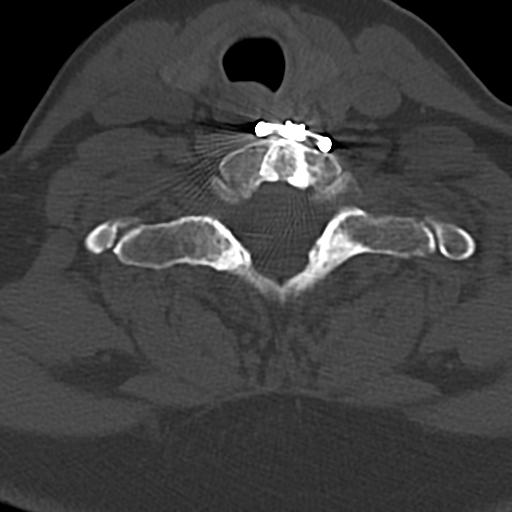
[im 47/93  bone]
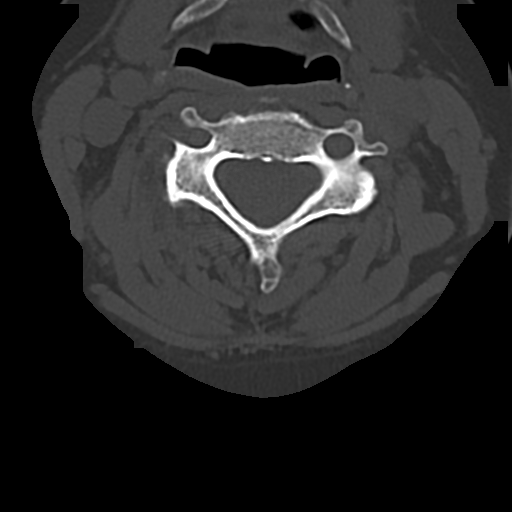
[im 70/93  bone]
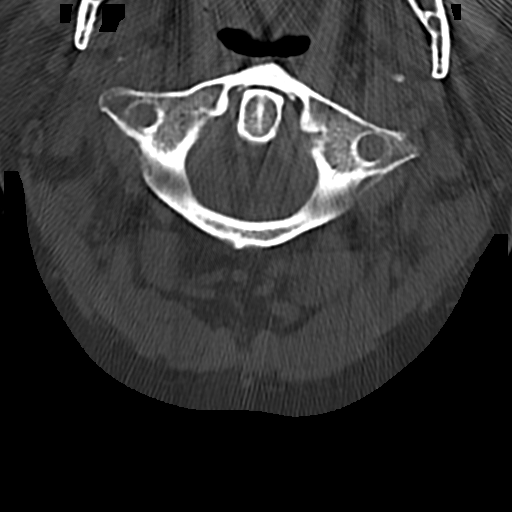

[Series 5: c-spine 2.00 br60 s3 sag bone · sagittal · 0.23mm/px · 5 of 56 slices shown, 6 images]
[im 19/56  bone]
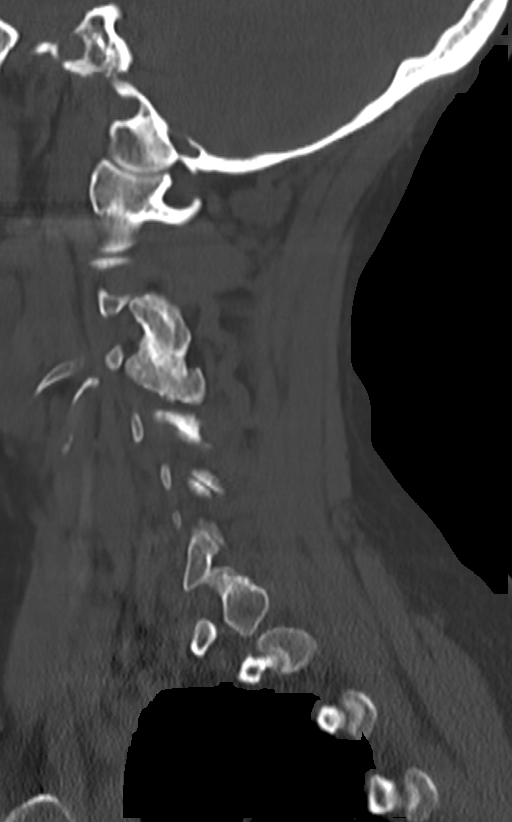
[im 23/56  bone]
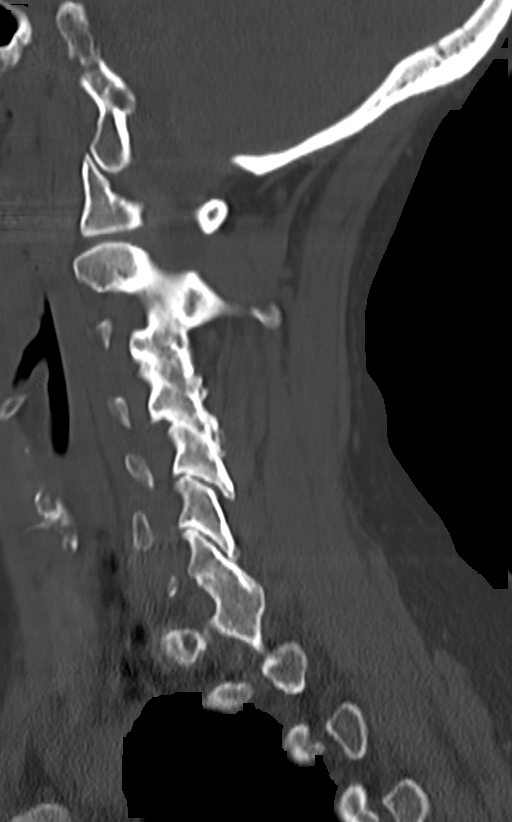
[im 28/56  soft-tissue]
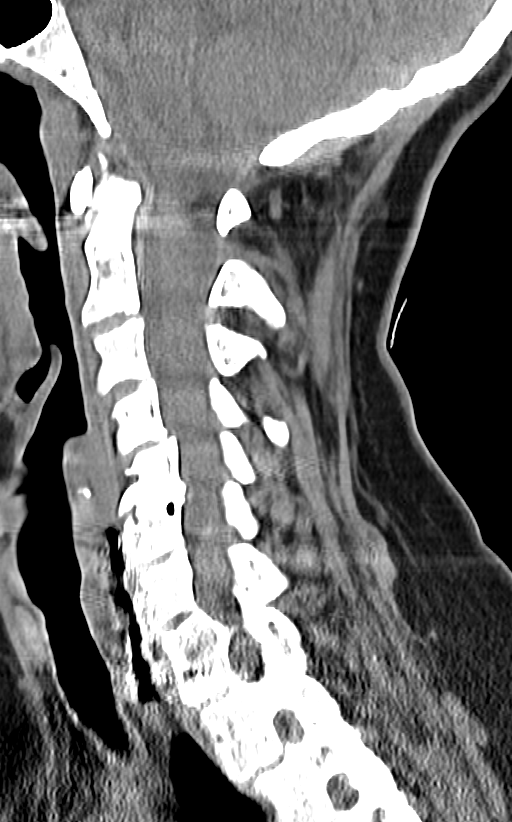
[im 28/56  bone]
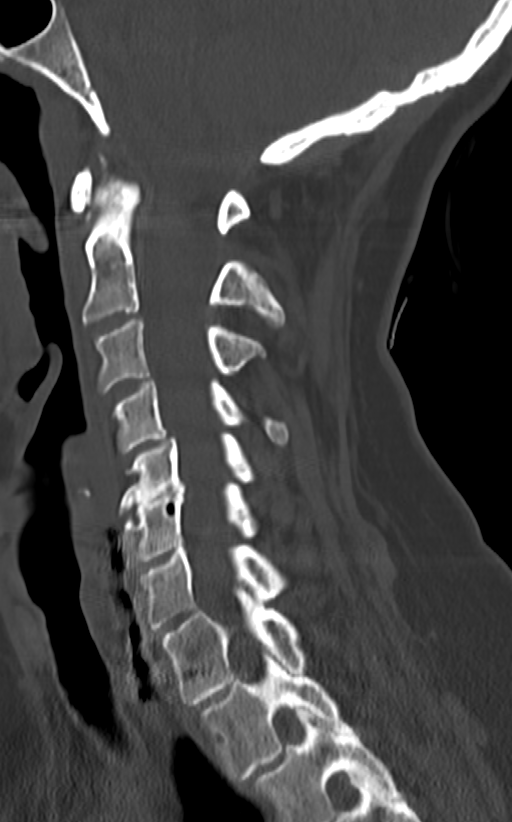
[im 33/56  bone]
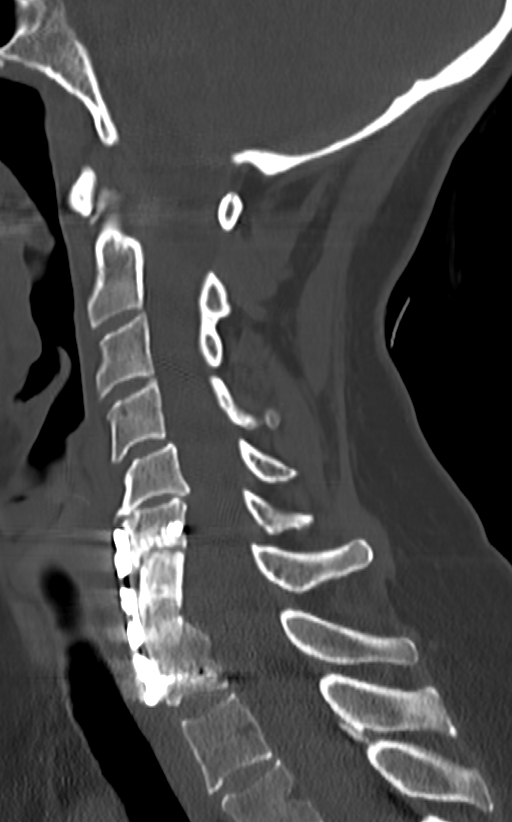
[im 37/56  bone]
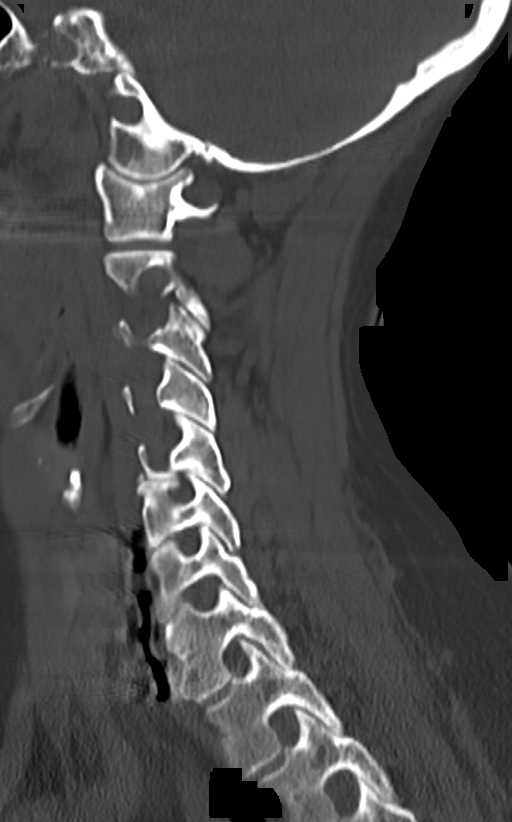

[Series 7: c-spine 2.00 hr60 s3 cor bone · coronal · 0.23mm/px · 3 of 58 slices shown]
[im 12/58  bone]
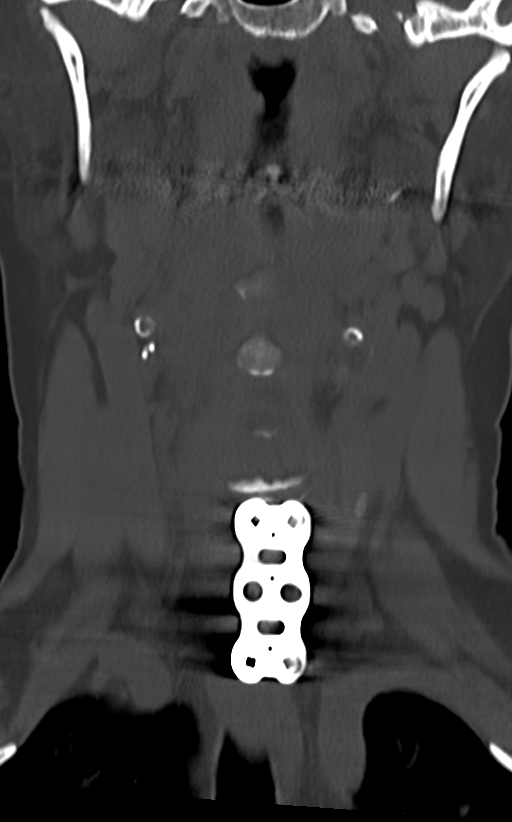
[im 23/58  bone]
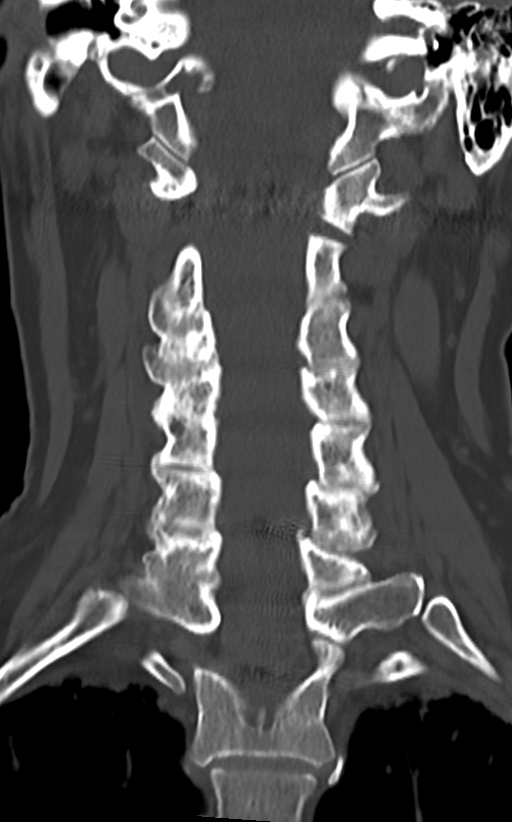
[im 35/58  bone]
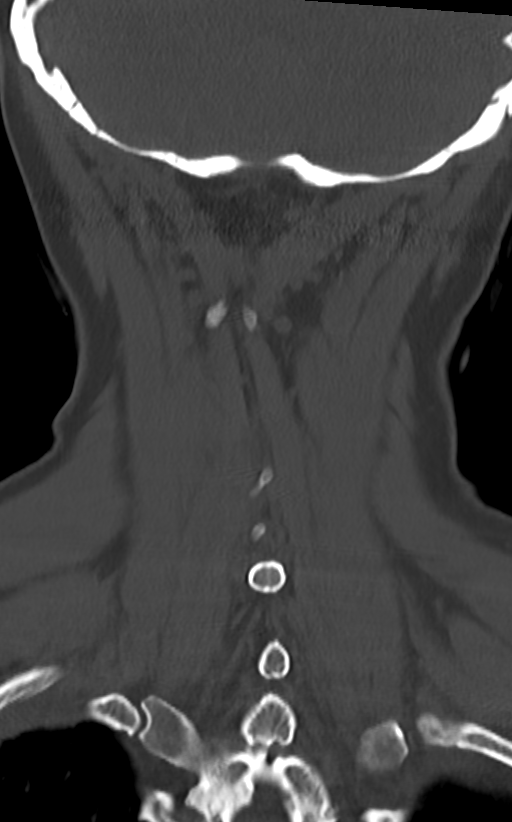

[11 of 33 positions shown; findings below may reference images not displayed]

FINDINGS: Alignment: There is trace anterolisthesis C4 on C5 due to facet
degenerative disease.

Skull base and vertebrae: No fracture or worrisome lesion. The
patient is status post C7 corpectomy and C6-T1 fusion with anterior
plate and screws in place. Hardware is intact without evidence of
loosening. There is minimal incorporation of graft material into the
C6 inferior endplate. There is no gas at the interface of the C6
vertebral body and graft material. Graft material is solidly
incorporated into the superior endplate of C7 and into the remnants
of the C6 vertebral body.

Soft tissues and spinal canal: Negative.

Disc levels: C2-3: The right facet joints are ankylosed. No disc
bulge or protrusion. No stenosis.

C3-4: The right facet joints are ankylosed. No disc bulge or
protrusion. No stenosis.

C4-5: Advanced facet degenerative change on the right. The right
facets appear ankylosed. There is trace anterolisthesis that mild
uncovertebral spurring is present. The central canal and left
foramen are open. Mild right foraminal narrowing is present.

C5-6: Shallow disc osteophyte complex and uncovertebral spurring.
The central canal and right foramen are widely patent. Mild left
foraminal narrowing noted.

C6-7: Status post fusion.  The central canal and foramina are open.

C7-T1: Solidly fused.  The central canal and foramina are open.

Upper chest: There is some biapical scarring.

Other: None.
IMPRESSION: Status post C7 corpectomy and C6-T1 fusion. There is little to no
incorporation of graft material into the inferior endplate of C6
worrisome for pseudoarthrosis although no evidence of hardware
loosening or gas at the interface between C6 and graft material is
identified. Graft is solidly incorporated into the superior endplate
of T1 and remnants of the C7 vertebral body.

## 2020-02-23 ENCOUNTER — Ambulatory Visit
Admission: EM | Admit: 2020-02-23 | Discharge: 2020-02-23 | Disposition: A | Discharge status: Home / Self Care | Payer: 59 | Attending: Physician Assistant | Admitting: Physician Assistant

## 2020-02-23 ENCOUNTER — Other Ambulatory Visit: Payer: Self-pay

## 2020-02-23 DIAGNOSIS — H6122 Impacted cerumen, left ear: Secondary | ICD-10-CM

## 2020-02-23 DIAGNOSIS — H6692 Otitis media, unspecified, left ear: Secondary | ICD-10-CM

## 2020-02-23 MED ORDER — AMOXICILLIN 500 MG PO CAPS: 500.0000 mg | ORAL_CAPSULE | Freq: Three times a day (TID) | ORAL | 0 refills | Status: AC

## 2020-02-23 NOTE — Discharge Instructions (Signed)
See your ENT as scheduled for evaluation

## 2020-02-23 NOTE — ED Triage Notes (Signed)
Pt c/o lt ear fullness x5-6days

## 2020-02-24 NOTE — ED Provider Notes (Signed)
[facility]    CSN: [id number] Arrival date & time: 02/23/20  1634      History   Chief Complaint Chief Complaint  Patient presents with   Ear Fullness    HPI Alicia Harper is a 59 y.o. female.   The history is provided by the patient. No language interpreter was used.  Ear Fullness This is a new problem. The current episode started more than 2 days ago. The problem occurs constantly. The problem has been gradually worsening. Nothing aggravates the symptoms. Nothing relieves the symptoms. She has tried nothing for the symptoms. The treatment provided no relief.  Pt complains of left ear pain and difficulty hearing   Past Medical History:  Diagnosis Date   Arthritis    Hyperlipidemia    Insomnia    Neuromuscular disorder ([place])    fibromyalgia   Thyroid disease     Patient Active Problem List   Diagnosis Date Noted   C7 cervical fracture ([place]) 02/15/2017   ETD (Eustachian tube dysfunction), right 08/01/2016   BPPV (benign paroxysmal positional vertigo), unspecified laterality 08/01/2016   Numbness 07/25/2016   Itching 06/04/2016   PND (post-nasal drip) 06/04/2016   Environmental and seasonal allergies 06/04/2016   Vitamin D insufficiency 06/04/2016   Prediabetes 06/04/2016   Chronic fatigue 06/04/2016   Hair loss 06/04/2016   Cold intolerance 06/04/2016   h/o Tick bites- never tested positive 05/08/2016   h/o Fatty liver disease, nonalcoholic 53/29/9242   Perimenopausal 11/15/2014   Primary osteoarthritis involving multiple joints 10/28/2014   Arthritis of carpometacarpal joint 10/04/2013   Fibromyalgia 07/07/2013   Primary insomnia 06/17/2013   h/o Subclinical hypothyroidism- now N 06/17/2013   Nonspecific abnormal results of other endocrine function study 05/13/2012   IBS (irritable bowel syndrome) 12/25/2011   Major depression, single episode, in complete remission ([place]) 12/25/2011    Past Surgical History:    Procedure Laterality Date   ANTERIOR CERVICAL CORPECTOMY N/A 02/16/2017   Procedure: Cervical seven  Anterior Cervical Corpectomy, Cervical six to Thoracic one arthrodesis;  Surgeon: Jovita Gamma, MD;  Location: [place];  Service: Neurosurgery;  Laterality: N/A;   Cervical seven  Anterior Cervical Corpectomy, Cervical six to Thoracic one arthrodesis    OB History   No obstetric history on file.      Home Medications    Prior to Admission medications   Medication Sig Start Date End Date Taking? Authorizing Provider  amoxicillin (AMOXIL) 500 MG capsule Take 1 capsule (500 mg total) by mouth 3 (three) times daily. 02/23/20   Fransico Meadow, PA-C  Ascorbic Acid (VITAMIN C PO) Take 1 tablet by mouth daily.    [provider]  Cholecalciferol (VITAMIN D3) 1000 units CAPS Take 1,000 Units by mouth daily.     [provider]  escitalopram (LEXAPRO) 20 MG tablet Take 1 tablet (20 mg total) by mouth daily. 1/2 Patient taking differently: Take 10 mg by mouth daily. 1/2 11/21/16   Danford, Valetta Fuller D, NP  fluticasone (FLONASE) 50 MCG/ACT nasal spray Place 2 sprays into both nostrils daily as needed for allergies.     [provider]  MAGNESIUM PO Take 1 capsule by mouth daily.    [provider]  meloxicam (MOBIC) 7.5 MG tablet Take 1 tablet (7.5 mg total) by mouth daily. 11/13/19   Tasia Catchings, Amy V, PA-C  Omega-3 Fatty Acids (FISH OIL PO) Take 1 capsule by mouth daily.    [provider]  Turmeric 450 MG CAPS Take  450 mg by mouth 2 (two) times daily.     [provider]  zolpidem (AMBIEN) 10 MG tablet TAKE 1 TABLET AT BEDTIME AS NEEDED FOR SLEEP Patient taking differently: Take 5-10 mg by mouth at bedtime as needed for sleep 10/04/16   Thomasene Lot, DO    Family History Family History  Problem Relation Age of Onset   Cancer Father        kidney   Hypertension Father    Hypothyroidism Father    Hypertension Maternal Grandmother    Stroke  Maternal Grandmother    Stroke Maternal Grandfather    Stroke Paternal Grandmother    Heart attack Paternal Grandfather    Hypothyroidism Mother    Hypothyroidism Sister     Social History Social History   Tobacco Use   Smoking status: Never Smoker   Smokeless tobacco: Never Used  Substance Use Topics   Alcohol use: No   Drug use: No     Allergies   Tramadol and Savella [milnacipran]   Review of Systems Review of Systems  All other systems reviewed and are negative.    Physical Exam Triage Vital Signs ED Triage Vitals [02/23/20 1650]  Enc Vitals Group     BP 121/69     Pulse Rate 92     Resp 16     Temp 98.3 F (36.8 C)     Temp Source Oral     SpO2 96 %     Weight      Height      Head Circumference      Peak Flow      Pain Score 2     Pain Loc      Pain Edu?      Excl. in [place]?    No data found.  Updated Vital Signs BP 121/69 (BP Location: Left Arm)    Pulse 92    Temp 98.3 F (36.8 C) (Oral)    Resp 16    SpO2 96%   Visual Acuity Right Eye Distance:   Left Eye Distance:   Bilateral Distance:    Right Eye Near:   Left Eye Near:    Bilateral Near:     Physical Exam Vitals and nursing note reviewed.  Constitutional:      Appearance: She is well-developed.  HENT:     Head: Normocephalic.     Right Ear: Tympanic membrane normal.     Left Ear: There is impacted cerumen.     Nose: Nose normal.     Mouth/Throat:     Mouth: Mucous membranes are moist.  Eyes:     Pupils: Pupils are equal, round, and reactive to light.  Cardiovascular:     Rate and Rhythm: Normal rate.  Pulmonary:     Effort: Pulmonary effort is normal.  Abdominal:     General: There is no distension.  Musculoskeletal:        General: Normal range of motion.     Cervical back: Normal range of motion.  Neurological:     Mental Status: She is alert and oriented to person, place, and time.      UC Treatments / Results  Labs (all labs ordered are listed, but  only abnormal results are displayed) Labs Reviewed - No data to display  EKG   Radiology No results found.  Procedures Procedures (including critical care time)  Medications Ordered in UC Medications - No data to display  Initial Impression / Assessment and Plan /  UC Course  I have reviewed the triage vital signs and the nursing notes.  Pertinent labs & imaging results that were available during my care of the patient were reviewed by me and considered in my medical decision making (see chart for details).     MDM  Rn irrigated wax from left ear canal.  Pt reports hearing is better, still painful,  TM is red,  I am sure if this is irritation from irrigation of infection.  I will treat pt with amoxicillian  Final Clinical Impressions(s) / UC Diagnoses   Final diagnoses:  Left otitis media, unspecified otitis media type  Impacted cerumen of left ear     Discharge Instructions     See your ENT as scheduled for evaluation    ED Prescriptions    Medication Sig Dispense Auth. Provider   amoxicillin (AMOXIL) 500 MG capsule Take 1 capsule (500 mg total) by mouth 3 (three) times daily. 30 capsule Elson Areas, [place]     PDMP not reviewed this encounter.  An After Visit Summary was printed and given to the patient.    Elson Areas, [place] 02/24/20 1454
